# Patient Record
Sex: Female | Born: 1960
Health system: Southern US, Community
[De-identification: ages and names within clinical notes are randomized; demographics above are authoritative.]

## PROBLEM LIST (undated history)

## (undated) DIAGNOSIS — E785 Hyperlipidemia, unspecified: Secondary | ICD-10-CM

## (undated) DIAGNOSIS — I10 Essential (primary) hypertension: Secondary | ICD-10-CM

## (undated) DIAGNOSIS — B019 Varicella without complication: Secondary | ICD-10-CM

## (undated) DIAGNOSIS — N200 Calculus of kidney: Secondary | ICD-10-CM

## (undated) DIAGNOSIS — R319 Hematuria, unspecified: Secondary | ICD-10-CM

## (undated) DIAGNOSIS — R6889 Other general symptoms and signs: Secondary | ICD-10-CM

## (undated) DIAGNOSIS — T7840XA Allergy, unspecified, initial encounter: Secondary | ICD-10-CM

## (undated) DIAGNOSIS — K219 Gastro-esophageal reflux disease without esophagitis: Secondary | ICD-10-CM

## (undated) HISTORY — DX: Allergy, unspecified, initial encounter: T78.40XA

## (undated) HISTORY — DX: Hyperlipidemia, unspecified: E78.5

## (undated) HISTORY — DX: Calculus of kidney: N20.0

## (undated) HISTORY — DX: Hematuria, unspecified: R31.9

## (undated) HISTORY — DX: Other general symptoms and signs: R68.89

## (undated) HISTORY — PX: TUBAL LIGATION: SHX77

## (undated) HISTORY — PX: WISDOM TOOTH EXTRACTION: SHX21

## (undated) HISTORY — DX: Gastro-esophageal reflux disease without esophagitis: K21.9

## (undated) HISTORY — DX: Varicella without complication: B01.9

## (undated) HISTORY — DX: Essential (primary) hypertension: I10

---

## 1991-09-22 HISTORY — PX: OTHER SURGICAL HISTORY: SHX169

## 2000-11-18 ENCOUNTER — Other Ambulatory Visit: Admission: RE | Admit: 2000-11-18 | Discharge: 2000-11-18 | Payer: Self-pay | Admitting: Obstetrics and Gynecology

## 2009-06-18 ENCOUNTER — Ambulatory Visit (HOSPITAL_BASED_OUTPATIENT_CLINIC_OR_DEPARTMENT_OTHER): Admission: RE | Admit: 2009-06-18 | Discharge: 2009-06-18 | Payer: Self-pay | Admitting: Family Medicine

## 2009-06-18 ENCOUNTER — Ambulatory Visit: Payer: Self-pay | Admitting: Diagnostic Radiology

## 2012-06-16 ENCOUNTER — Ambulatory Visit (INDEPENDENT_AMBULATORY_CARE_PROVIDER_SITE_OTHER): Payer: BC Managed Care – PPO | Admitting: Family Medicine

## 2012-06-16 ENCOUNTER — Encounter: Payer: Self-pay | Admitting: Family Medicine

## 2012-06-16 VITALS — BP 141/85 | HR 68 | Temp 97.8°F | Ht 63.25 in | Wt 174.8 lb

## 2012-06-16 DIAGNOSIS — Z23 Encounter for immunization: Secondary | ICD-10-CM

## 2012-06-16 DIAGNOSIS — T7840XA Allergy, unspecified, initial encounter: Secondary | ICD-10-CM

## 2012-06-16 DIAGNOSIS — R319 Hematuria, unspecified: Secondary | ICD-10-CM

## 2012-06-16 DIAGNOSIS — J309 Allergic rhinitis, unspecified: Secondary | ICD-10-CM

## 2012-06-16 DIAGNOSIS — K219 Gastro-esophageal reflux disease without esophagitis: Secondary | ICD-10-CM

## 2012-06-16 DIAGNOSIS — N200 Calculus of kidney: Secondary | ICD-10-CM

## 2012-06-16 DIAGNOSIS — Z0000 Encounter for general adult medical examination without abnormal findings: Secondary | ICD-10-CM

## 2012-06-16 DIAGNOSIS — R030 Elevated blood-pressure reading, without diagnosis of hypertension: Secondary | ICD-10-CM

## 2012-06-16 DIAGNOSIS — IMO0001 Reserved for inherently not codable concepts without codable children: Secondary | ICD-10-CM

## 2012-06-16 DIAGNOSIS — E785 Hyperlipidemia, unspecified: Secondary | ICD-10-CM

## 2012-06-16 DIAGNOSIS — R6889 Other general symptoms and signs: Secondary | ICD-10-CM

## 2012-06-16 DIAGNOSIS — R350 Frequency of micturition: Secondary | ICD-10-CM

## 2012-06-16 DIAGNOSIS — R1310 Dysphagia, unspecified: Secondary | ICD-10-CM

## 2012-06-16 HISTORY — DX: Other general symptoms and signs: R68.89

## 2012-06-16 LAB — POCT URINALYSIS DIPSTICK
Bilirubin, UA: NEGATIVE
Blood, UA: NEGATIVE
Glucose, UA: NEGATIVE
Ketones, UA: NEGATIVE
Spec Grav, UA: 1.01

## 2012-06-16 MED ORDER — LORATADINE 10 MG PO TABS: 10.0000 mg | ORAL_TABLET | Freq: Every day | ORAL | Status: DC

## 2012-06-16 MED ORDER — TETANUS-DIPHTH-ACELL PERTUSSIS 5-2.5-18.5 LF-MCG/0.5 IM SUSP: 0.5000 mL | Freq: Once | INTRAMUSCULAR | Status: DC

## 2012-06-16 MED ORDER — RANITIDINE HCL 300 MG PO TABS: 300.0000 mg | ORAL_TABLET | Freq: Every day | ORAL | Status: DC

## 2012-06-16 NOTE — Assessment & Plan Note (Signed)
Start Ranitidine 300 mg po qhs for now and check an H Pylori, avoid offending foods. Referred to GI due to increasing dysphagia

## 2012-06-16 NOTE — Assessment & Plan Note (Signed)
Avoid trans fats, start MegaRed and check lipid panel

## 2012-06-16 NOTE — Assessment & Plan Note (Signed)
No recent episodes

## 2012-06-16 NOTE — Assessment & Plan Note (Signed)
Patient denies any previous history will recheck at next visit, check renal panel and tsh

## 2012-06-16 NOTE — Progress Notes (Signed)
Patient ID: Sarah Suarez, female   DOB: Apr 08, 1961, 51 y.o.   MRN: [id number].  JANET GOELZ [id number] 1961/03/31 06/16/2012      Progress Note New Patient  Subjective  Chief Complaint  Chief Complaint  Patient presents with  . Establish Care    new patient- feels like something is in her throat/neck when turning head    HPI  Patient is a 51 year old Caucasian female who is in today for new patient appointment. Her biggest concern is recent worsening of some dysphagia. She's had trouble off and on for years but for the past month it has been good up notably worse. She notes she is most troubled water then she goes down better but she'll choke easily when she swelling. She notes she snores wakes up choking and has trouble managing her own saliva. Does acknowledge a history of reflux but that has not been so bad recently since she changed her diet and is eating less offensive foods. She does also show with allergies and nasal congestion but is not taking medications for that. Has a history of kidney stones but has not had one recently. No fevers, chills, headache, chest pain, palpitations, shortness of breath, GU complaints  Past Medical History  Diagnosis Date  . Chicken pox as a child  . Kidney stone   . Hematuria   . Hyperlipidemia   . GERD (gastroesophageal reflux disease)   . Allergy   . Elevated BP 06/16/2012  . Neck problem 06/16/2012  . Preventative health care 06/16/2012    Past Surgical History  Procedure Date  . Abdominal hysterectomy 1993    partial- still has ovaries  . Cesarean section 1986 and 1987  . Wisdom tooth extraction 51 yrs old    Family History  Problem Relation Age of Onset  . Dementia Father 82  . Hyperlipidemia Father   . Hypertension Father   . Cancer Brother 50    throat- smoke- remission?  . Dementia Maternal Grandmother   . Heart attack Maternal Grandfather   . Cancer Paternal Grandfather     unsure of type  . Alcohol abuse Brother      History   Social History  . Marital Status: Married    Spouse Name: N/A    Number of Children: N/A  . Years of Education: N/A   Occupational History  . Not on file.   Social History Main Topics  . Smoking status: Former Games developer  . Smokeless tobacco: Never Used   Comment: a little in high school  . Alcohol Use: No  . Drug Use: No  . Sexually Active: Yes -- Female partner(s)   Other Topics Concern  . Not on file   Social History Narrative  . No narrative on file    Current Outpatient Prescriptions on File Prior to Visit  Medication Sig Dispense Refill  . loratadine (CLARITIN) 10 MG tablet Take 1 tablet (10 mg total) by mouth daily.  30 tablet  11  . ranitidine (ZANTAC) 300 MG tablet Take 1 tablet (300 mg total) by mouth at bedtime.  30 tablet  3   No current facility-administered medications on file prior to visit.    No Known Allergies  Review of Systems  Review of Systems  Constitutional: Negative for fever, chills and malaise/fatigue.  HENT: Positive for congestion and sore throat. Negative for hearing loss and nosebleeds.   Eyes: Negative for discharge.  Respiratory: Negative for cough, sputum production, shortness of breath and wheezing.  Cardiovascular: Negative for chest pain, palpitations and leg swelling.  Gastrointestinal: Positive for heartburn. Negative for nausea, vomiting, abdominal pain, diarrhea, constipation and blood in stool.  Genitourinary: Negative for dysuria, urgency, frequency and hematuria.  Musculoskeletal: Negative for myalgias, back pain and falls.  Skin: Negative for rash.  Neurological: Negative for dizziness, tremors, sensory change, focal weakness, loss of consciousness, weakness and headaches.  Endo/Heme/Allergies: Negative for polydipsia. Does not bruise/bleed easily.  Psychiatric/Behavioral: Negative for depression and suicidal ideas. The patient is not nervous/anxious and does not have insomnia.     Objective  BP 141/85   Pulse 68  Temp 97.8 F (36.6 C) (Temporal)  Ht 5' 3.25" (1.607 m)  Wt 174 lb 12.8 oz (79.289 kg)  BMI 30.72 kg/m2  SpO2 99%  Physical Exam  Physical Exam  Constitutional: She is oriented to person, place, and time and well-developed, well-nourished, and in no distress. No distress.  HENT:  Head: Normocephalic and atraumatic.  Right Ear: External ear normal.  Left Ear: External ear normal.  Nose: Nose normal.  Mouth/Throat: Oropharynx is clear and moist. No oropharyngeal exudate.  Eyes: Conjunctivae normal are normal. Pupils are equal, round, and reactive to light. Right eye exhibits no discharge. Left eye exhibits no discharge. No scleral icterus.  Neck: Normal range of motion. Neck supple. No thyromegaly present.  Cardiovascular: Normal rate, regular rhythm, normal heart sounds and intact distal pulses.   No murmur heard. Pulmonary/Chest: Effort normal and breath sounds normal. No respiratory distress. She has no wheezes. She has no rales.  Abdominal: Soft. Bowel sounds are normal. She exhibits no distension and no mass. There is no tenderness.  Musculoskeletal: Normal range of motion. She exhibits no edema and no tenderness.  Lymphadenopathy:    She has no cervical adenopathy.  Neurological: She is alert and oriented to person, place, and time. She has normal reflexes. No cranial nerve deficit. Coordination normal.  Skin: Skin is warm and dry. No rash noted. She is not diaphoretic.  Psychiatric: Mood, memory and affect normal.       Assessment & Plan  Allergic state Start Loratadine 10 mg daily  Elevated BP Patient denies any previous history will recheck at next visit, check renal panel and tsh  GERD (gastroesophageal reflux disease) Start Ranitidine 300 mg po qhs for now and check an H Pylori, avoid offending foods. Referred to GI due to increasing dysphagia  Hematuria Urine clear on dip today  Kidney stone No recent episodes  Hyperlipidemia Avoid trans fats,  start MegaRed and check lipid panel  Neck problem Patient c/o years of a sense of a lesion intermittently deep in her neck for years. Sometimes she can palpate it , it is on the left. Will check a TSH and thyroid ultrasound as well  Preventative health care Avoid trans fats, increase exercise, encouraged referral for screening colonoscopy and patient agrees, check fasting labs

## 2012-06-16 NOTE — Assessment & Plan Note (Addendum)
Avoid trans fats, increase exercise, encouraged referral for screening colonoscopy and patient agrees, check fasting labs

## 2012-06-16 NOTE — Patient Instructions (Addendum)
Preventive Care for Adults, Female A healthy lifestyle and preventive care can promote health and wellness. Preventive health guidelines for women include the following key practices.  A routine yearly physical is a good way to check with your caregiver about your health and preventive screening. It is a chance to share any concerns and updates on your health, and to receive a thorough exam.   Visit your dentist for a routine exam and preventive care every 6 months. Brush your teeth twice a day and floss once a day. Good oral hygiene prevents tooth decay and gum disease.   The frequency of eye exams is based on your age, health, family medical history, use of contact lenses, and other factors. Follow your caregiver's recommendations for frequency of eye exams.   Eat a healthy diet. Foods like vegetables, fruits, whole grains, low-fat dairy products, and lean protein foods contain the nutrients you need without too many calories. Decrease your intake of foods high in solid fats, added sugars, and salt. Eat the right amount of calories for you.Get information about a proper diet from your caregiver, if necessary.   Regular physical exercise is one of the most important things you can do for your health. Most adults should get at least 150 minutes of moderate-intensity exercise (any activity that increases your heart rate and causes you to sweat) each week. In addition, most adults need muscle-strengthening exercises on 2 or more days a week.   Maintain a healthy weight. The body mass index (BMI) is a screening tool to identify possible weight problems. It provides an estimate of body fat based on height and weight. Your caregiver can help determine your BMI, and can help you achieve or maintain a healthy weight.For adults 20 years and older:   A BMI below 18.5 is considered underweight.   A BMI of 18.5 to 24.9 is normal.   A BMI of 25 to 29.9 is considered overweight.   A BMI of 30 and above is  considered obese.   Maintain normal blood lipids and cholesterol levels by exercising and minimizing your intake of saturated fat. Eat a balanced diet with plenty of fruit and vegetables. Blood tests for lipids and cholesterol should begin at age 20 and be repeated every 5 years. If your lipid or cholesterol levels are high, you are over 50, or you are at high risk for heart disease, you may need your cholesterol levels checked more frequently.Ongoing high lipid and cholesterol levels should be treated with medicines if diet and exercise are not effective.   If you smoke, find out from your caregiver how to quit. If you do not use tobacco, do not start.   If you are pregnant, do not drink alcohol. If you are breastfeeding, be very cautious about drinking alcohol. If you are not pregnant and choose to drink alcohol, do not exceed 1 drink per day. One drink is considered to be 12 ounces (355 mL) of beer, 5 ounces (148 mL) of wine, or 1.5 ounces (44 mL) of liquor.   Avoid use of street drugs. Do not share needles with anyone. Ask for help if you need support or instructions about stopping the use of drugs.   High blood pressure causes heart disease and increases the risk of stroke. Your blood pressure should be checked at least every 1 to 2 years. Ongoing high blood pressure should be treated with medicines if weight loss and exercise are not effective.   If you are 55 to 51   years old, ask your caregiver if you should take aspirin to prevent strokes.   Diabetes screening involves taking a blood sample to check your fasting blood sugar level. This should be done once every 3 years, after age 45, if you are within normal weight and without risk factors for diabetes. Testing should be considered at a younger age or be carried out more frequently if you are overweight and have at least 1 risk factor for diabetes.   Breast cancer screening is essential preventive care for women. You should practice "breast  self-awareness." This means understanding the normal appearance and feel of your breasts and may include breast self-examination. Any changes detected, no matter how small, should be reported to a caregiver. Women in their 20s and 30s should have a clinical breast exam (CBE) by a caregiver as part of a regular health exam every 1 to 3 years. After age 40, women should have a CBE every year. Starting at age 40, women should consider having a mammography (breast X-ray test) every year. Women who have a family history of breast cancer should talk to their caregiver about genetic screening. Women at a high risk of breast cancer should talk to their caregivers about having magnetic resonance imaging (MRI) and a mammography every year.   The Pap test is a screening test for cervical cancer. A Pap test can show cell changes on the cervix that might become cervical cancer if left untreated. A Pap test is a procedure in which cells are obtained and examined from the lower end of the uterus (cervix).   Women should have a Pap test starting at age 21.   Between ages 21 and 29, Pap tests should be repeated every 2 years.   Beginning at age 30, you should have a Pap test every 3 years as long as the past 3 Pap tests have been normal.   Some women have medical problems that increase the chance of getting cervical cancer. Talk to your caregiver about these problems. It is especially important to talk to your caregiver if a new problem develops soon after your last Pap test. In these cases, your caregiver may recommend more frequent screening and Pap tests.   The above recommendations are the same for women who have or have not gotten the vaccine for human papillomavirus (HPV).   If you had a hysterectomy for a problem that was not cancer or a condition that could lead to cancer, then you no longer need Pap tests. Even if you no longer need a Pap test, a regular exam is a good idea to make sure no other problems are  starting.   If you are between ages 65 and 70, and you have had normal Pap tests going back 10 years, you no longer need Pap tests. Even if you no longer need a Pap test, a regular exam is a good idea to make sure no other problems are starting.   If you have had past treatment for cervical cancer or a condition that could lead to cancer, you need Pap tests and screening for cancer for at least 20 years after your treatment.   If Pap tests have been discontinued, risk factors (such as a new sexual partner) need to be reassessed to determine if screening should be resumed.   The HPV test is an additional test that may be used for cervical cancer screening. The HPV test looks for the virus that can cause the cell changes on the cervix.   The cells collected during the Pap test can be tested for HPV. The HPV test could be used to screen women aged 30 years and older, and should be used in women of any age who have unclear Pap test results. After the age of 30, women should have HPV testing at the same frequency as a Pap test.   Colorectal cancer can be detected and often prevented. Most routine colorectal cancer screening begins at the age of 50 and continues through age 75. However, your caregiver may recommend screening at an earlier age if you have risk factors for colon cancer. On a yearly basis, your caregiver may provide home test kits to check for hidden blood in the stool. Use of a small camera at the end of a tube, to directly examine the colon (sigmoidoscopy or colonoscopy), can detect the earliest forms of colorectal cancer. Talk to your caregiver about this at age 50, when routine screening begins. Direct examination of the colon should be repeated every 5 to 10 years through age 75, unless early forms of pre-cancerous polyps or small growths are found.   Hepatitis C blood testing is recommended for all people born from 1945 through 1965 and any individual with known risks for hepatitis C.    Practice safe sex. Use condoms and avoid high-risk sexual practices to reduce the spread of sexually transmitted infections (STIs). STIs include gonorrhea, chlamydia, syphilis, trichomonas, herpes, HPV, and human immunodeficiency virus (HIV). Herpes, HIV, and HPV are viral illnesses that have no cure. They can result in disability, cancer, and death. Sexually active women aged 25 and younger should be checked for chlamydia. Older women with new or multiple partners should also be tested for chlamydia. Testing for other STIs is recommended if you are sexually active and at increased risk.   Osteoporosis is a disease in which the bones lose minerals and strength with aging. This can result in serious bone fractures. The risk of osteoporosis can be identified using a bone density scan. Women ages 65 and over and women at risk for fractures or osteoporosis should discuss screening with their caregivers. Ask your caregiver whether you should take a calcium supplement or vitamin D to reduce the rate of osteoporosis.   Menopause can be associated with physical symptoms and risks. Hormone replacement therapy is available to decrease symptoms and risks. You should talk to your caregiver about whether hormone replacement therapy is right for you.   Use sunscreen with sun protection factor (SPF) of 30 or more. Apply sunscreen liberally and repeatedly throughout the day. You should seek shade when your shadow is shorter than you. Protect yourself by wearing long sleeves, pants, a wide-brimmed hat, and sunglasses year round, whenever you are outdoors.   Once a month, do a whole body skin exam, using a mirror to look at the skin on your back. Notify your caregiver of new moles, moles that have irregular borders, moles that are larger than a pencil eraser, or moles that have changed in shape or color.   Stay current with required immunizations.   Influenza. You need a dose every fall (or winter). The composition of  the flu vaccine changes each year, so being vaccinated once is not enough.   Pneumococcal polysaccharide. You need 1 to 2 doses if you smoke cigarettes or if you have certain chronic medical conditions. You need 1 dose at age 65 (or older) if you have never been vaccinated.   Tetanus, diphtheria, pertussis (Tdap, Td). Get 1 dose of   Tdap vaccine if you are younger than age 65, are over 65 and have contact with an infant, are a healthcare worker, are pregnant, or simply want to be protected from whooping cough. After that, you need a Td booster dose every 10 years. Consult your caregiver if you have not had at least 3 tetanus and diphtheria-containing shots sometime in your life or have a deep or dirty wound.   HPV. You need this vaccine if you are a woman age 26 or younger. The vaccine is given in 3 doses over 6 months.   Measles, mumps, rubella (MMR). You need at least 1 dose of MMR if you were born in 1957 or later. You may also need a second dose.   Meningococcal. If you are age 19 to 21 and a first-year college student living in a residence hall, or have one of several medical conditions, you need to get vaccinated against meningococcal disease. You may also need additional booster doses.   Zoster (shingles). If you are age 60 or older, you should get this vaccine.   Varicella (chickenpox). If you have never had chickenpox or you were vaccinated but received only 1 dose, talk to your caregiver to find out if you need this vaccine.   Hepatitis A. You need this vaccine if you have a specific risk factor for hepatitis A virus infection or you simply wish to be protected from this disease. The vaccine is usually given as 2 doses, 6 to 18 months apart.   Hepatitis B. You need this vaccine if you have a specific risk factor for hepatitis B virus infection or you simply wish to be protected from this disease. The vaccine is given in 3 doses, usually over 6 months.  Preventive Services /  Frequency Ages 19 to 39  Blood pressure check.** / Every 1 to 2 years.   Lipid and cholesterol check.** / Every 5 years beginning at age 20.   Clinical breast exam.** / Every 3 years for women in their 20s and 30s.   Pap test.** / Every 2 years from ages 21 through 29. Every 3 years starting at age 30 through age 65 or 70 with a history of 3 consecutive normal Pap tests.   HPV screening.** / Every 3 years from ages 30 through ages 65 to 70 with a history of 3 consecutive normal Pap tests.   Hepatitis C blood test.** / For any individual with known risks for hepatitis C.   Skin self-exam. / Monthly.   Influenza immunization.** / Every year.   Pneumococcal polysaccharide immunization.** / 1 to 2 doses if you smoke cigarettes or if you have certain chronic medical conditions.   Tetanus, diphtheria, pertussis (Tdap, Td) immunization. / A one-time dose of Tdap vaccine. After that, you need a Td booster dose every 10 years.   HPV immunization. / 3 doses over 6 months, if you are 26 and younger.   Measles, mumps, rubella (MMR) immunization. / You need at least 1 dose of MMR if you were born in 1957 or later. You may also need a second dose.   Meningococcal immunization. / 1 dose if you are age 19 to 21 and a first-year college student living in a residence hall, or have one of several medical conditions, you need to get vaccinated against meningococcal disease. You may also need additional booster doses.   Varicella immunization.** / Consult your caregiver.   Hepatitis A immunization.** / Consult your caregiver. 2 doses, 6 to 18 months   apart.   Hepatitis B immunization.** / Consult your caregiver. 3 doses usually over 6 months.  Ages 40 to 64  Blood pressure check.** / Every 1 to 2 years.   Lipid and cholesterol check.** / Every 5 years beginning at age 20.   Clinical breast exam.** / Every year after age 40.   Mammogram.** / Every year beginning at age 40 and continuing for as  long as you are in good health. Consult with your caregiver.   Pap test.** / Every 3 years starting at age 30 through age 65 or 70 with a history of 3 consecutive normal Pap tests.   HPV screening.** / Every 3 years from ages 30 through ages 65 to 70 with a history of 3 consecutive normal Pap tests.   Fecal occult blood test (FOBT) of stool. / Every year beginning at age 50 and continuing until age 75. You may not need to do this test if you get a colonoscopy every 10 years.   Flexible sigmoidoscopy or colonoscopy.** / Every 5 years for a flexible sigmoidoscopy or every 10 years for a colonoscopy beginning at age 50 and continuing until age 75.   Hepatitis C blood test.** / For all people born from 1945 through 1965 and any individual with known risks for hepatitis C.   Skin self-exam. / Monthly.   Influenza immunization.** / Every year.   Pneumococcal polysaccharide immunization.** / 1 to 2 doses if you smoke cigarettes or if you have certain chronic medical conditions.   Tetanus, diphtheria, pertussis (Tdap, Td) immunization.** / A one-time dose of Tdap vaccine. After that, you need a Td booster dose every 10 years.   Measles, mumps, rubella (MMR) immunization. / You need at least 1 dose of MMR if you were born in 1957 or later. You may also need a second dose.   Varicella immunization.** / Consult your caregiver.   Meningococcal immunization.** / Consult your caregiver.   Hepatitis A immunization.** / Consult your caregiver. 2 doses, 6 to 18 months apart.   Hepatitis B immunization.** / Consult your caregiver. 3 doses, usually over 6 months.  Ages 65 and over  Blood pressure check.** / Every 1 to 2 years.   Lipid and cholesterol check.** / Every 5 years beginning at age 20.   Clinical breast exam.** / Every year after age 40.   Mammogram.** / Every year beginning at age 40 and continuing for as long as you are in good health. Consult with your caregiver.   Pap test.** /  Every 3 years starting at age 30 through age 65 or 70 with a 3 consecutive normal Pap tests. Testing can be stopped between 65 and 70 with 3 consecutive normal Pap tests and no abnormal Pap or HPV tests in the past 10 years.   HPV screening.** / Every 3 years from ages 30 through ages 65 or 70 with a history of 3 consecutive normal Pap tests. Testing can be stopped between 65 and 70 with 3 consecutive normal Pap tests and no abnormal Pap or HPV tests in the past 10 years.   Fecal occult blood test (FOBT) of stool. / Every year beginning at age 50 and continuing until age 75. You may not need to do this test if you get a colonoscopy every 10 years.   Flexible sigmoidoscopy or colonoscopy.** / Every 5 years for a flexible sigmoidoscopy or every 10 years for a colonoscopy beginning at age 50 and continuing until age 75.   Hepatitis   C blood test.** / For all people born from [birth year] through 1965 and any individual with known risks for hepatitis C.   Osteoporosis screening.** / A one-time screening for women ages 37 and over and women at risk for fractures or osteoporosis.   Skin self-exam. / Monthly.   Influenza immunization.** / Every year.   Pneumococcal polysaccharide immunization.** / 1 dose at age 29 (or older) if you have never been vaccinated.   Tetanus, diphtheria, pertussis (Tdap, Td) immunization. / A one-time dose of Tdap vaccine if you are over 65 and have contact with an infant, are a Research scientist (physical sciences), or simply want to be protected from whooping cough. After that, you need a Td booster dose every 10 years.   Varicella immunization.** / Consult your caregiver.   Meningococcal immunization.** / Consult your caregiver.   Hepatitis A immunization.** / Consult your caregiver. 2 doses, 6 to 18 months apart.   Hepatitis B immunization.** / Check with your caregiver. 3 doses, usually over 6 months.  ** Family history and personal history of risk and conditions may change your caregiver's  recommendations. Document Released: 11/03/2001 Document Revised: 08/27/2011 Document Reviewed: 02/02/2011 [facility] Patient Information 2012 [place], [place].   MegaRed krill oil cap daily, by schiff, generic is fine

## 2012-06-16 NOTE — Assessment & Plan Note (Signed)
Urine clear on dip today

## 2012-06-16 NOTE — Assessment & Plan Note (Signed)
-   Start Loratadine 10 mg daily.

## 2012-06-16 NOTE — Assessment & Plan Note (Signed)
Patient c/o years of a sense of a lesion intermittently deep in her neck for years. Sometimes she can palpate it , it is on the left. Will check a TSH and thyroid ultrasound as well

## 2012-06-20 ENCOUNTER — Other Ambulatory Visit (INDEPENDENT_AMBULATORY_CARE_PROVIDER_SITE_OTHER): Payer: BC Managed Care – PPO

## 2012-06-20 DIAGNOSIS — Z0000 Encounter for general adult medical examination without abnormal findings: Secondary | ICD-10-CM

## 2012-06-20 LAB — RENAL FUNCTION PANEL
CO2: 25 mEq/L (ref 19–32)
Calcium: 9 mg/dL (ref 8.4–10.5)
Creatinine, Ser: 0.6 mg/dL (ref 0.4–1.2)
GFR: 104.02 mL/min (ref >60.00)
Glucose, Bld: 85 mg/dL (ref 70–99)
Potassium: 4.5 mEq/L (ref 3.5–5.1)
Sodium: 138 mEq/L (ref 135–145)

## 2012-06-20 LAB — LIPID PANEL
Cholesterol: 205 mg/dL — ABNORMAL HIGH (ref 0–200)
HDL: 52.7 mg/dL (ref >39.00)
Total CHOL/HDL Ratio: 4
Triglycerides: 65 mg/dL (ref 0.0–149.0)

## 2012-06-20 LAB — CBC
Hemoglobin: 13 g/dL (ref 12.0–15.0)
MCHC: 33.1 g/dL (ref 30.0–36.0)
Platelets: 204 10*3/uL (ref 150.0–400.0)
RDW: 12.3 % (ref 11.5–14.6)
WBC: 5.6 10*3/uL (ref 4.5–10.5)

## 2012-06-20 LAB — HEPATIC FUNCTION PANEL
Albumin: 3.9 g/dL (ref 3.5–5.2)
Total Bilirubin: 0.7 mg/dL (ref 0.3–1.2)

## 2012-06-20 LAB — TSH: TSH: 1.79 u[IU]/mL (ref 0.35–5.50)

## 2012-06-22 ENCOUNTER — Ambulatory Visit (HOSPITAL_BASED_OUTPATIENT_CLINIC_OR_DEPARTMENT_OTHER)
Admission: RE | Admit: 2012-06-22 | Discharge: 2012-06-22 | Disposition: A | Discharge status: Home / Self Care | Payer: BC Managed Care – PPO | Source: Ambulatory Visit | Attending: Family Medicine | Admitting: Family Medicine

## 2012-06-22 ENCOUNTER — Telehealth: Payer: Self-pay | Admitting: Family Medicine

## 2012-06-22 DIAGNOSIS — R1310 Dysphagia, unspecified: Secondary | ICD-10-CM

## 2012-06-22 DIAGNOSIS — IMO0001 Reserved for inherently not codable concepts without codable children: Secondary | ICD-10-CM

## 2012-06-22 NOTE — Telephone Encounter (Signed)
So have her gently massage it with Aspercreme for about the the next 2 weeks, twice a day, if no improvement, have her come in for an exam so we can decide which imaging test would be best to evaluate it

## 2012-06-22 NOTE — Telephone Encounter (Signed)
Caller: Simya/Patient; Patient Name: Sarah Suarez; PCP: Dan[facility]); Best Callback Phone Number: [phone]; Pt calling today 06/22/12 regarding she is having ultra sound done on her neck today at 2:30 PM.  She found a lump today below left shoulder on the front.  Said lump is not in breast tissue.  Wants to see if MD will include the ultra sound of this lump on the order with the neck ultra sound.  PLEASE CALL PATIENT BACK AT [phone] TO LET PT KNOW IF MD WILL INCLUDE THIS ON ULTRA SOUND ORDER TODAY.

## 2012-06-22 NOTE — Telephone Encounter (Signed)
Pt informed and voiced understanding

## 2012-06-22 NOTE — Telephone Encounter (Signed)
Would need more information, is it tender, how big, is it anterior or posterior to shoulder or in the Axillae? If she gets there and the Ultrasound tech feels it is amenable to Ultrasound I am willing to put in an order if they tell us what area we need to look at

## 2012-06-22 NOTE — Telephone Encounter (Signed)
Please advise 

## 2012-06-22 NOTE — Telephone Encounter (Signed)
Pt states its sore but has been touching it since she found it yesterday (was not sore when she first found it). Pt states it is about the size of a small bean. Its on the left side under the clavicle. Pt is already done with the ultrasound. Please advise?

## 2012-06-24 NOTE — Progress Notes (Signed)
Left detailed message on patient's home voicemail with ultrasound results.

## 2012-07-07 ENCOUNTER — Encounter: Payer: Self-pay | Admitting: Gastroenterology

## 2012-08-08 ENCOUNTER — Encounter: Payer: Self-pay | Admitting: Gastroenterology

## 2012-08-08 ENCOUNTER — Ambulatory Visit (INDEPENDENT_AMBULATORY_CARE_PROVIDER_SITE_OTHER): Payer: BC Managed Care – PPO | Admitting: Gastroenterology

## 2012-08-08 VITALS — BP 112/80 | HR 80 | Ht 63.0 in | Wt 182.4 lb

## 2012-08-08 DIAGNOSIS — K219 Gastro-esophageal reflux disease without esophagitis: Secondary | ICD-10-CM

## 2012-08-08 DIAGNOSIS — Z1211 Encounter for screening for malignant neoplasm of colon: Secondary | ICD-10-CM

## 2012-08-08 DIAGNOSIS — R1319 Other dysphagia: Secondary | ICD-10-CM

## 2012-08-08 MED ORDER — PEG-KCL-NACL-NASULF-NA ASC-C 100 G PO SOLR: 1.0000 | Freq: Once | ORAL | Status: DC

## 2012-08-08 NOTE — Progress Notes (Signed)
History of Present Illness: This is a 51 year old female who relates a several year history of a "gooey" and sticky sensation in her throat. She relates that the "gooey" and sticky sensation in her throat resolves with drinking water. She occasionally notes the symptoms at night with occasional coughing. She has no difficulty swallowing any solid or liquid food. She has rare episodes of substernal burning. She was evaluated by Dr. Cloria Spring several years ago and she report the evaluation was unremarkable and he recommended omeprazole for possible GERD. She states she took it for 2 or 3 weeks and did not note any symptom improvement and so she discontinued it. She was recently evaluated by Dr. Abner Greenspan and prescribed ranitidine 300 mg daily for possible GERD but has not taken it. Denies weight loss, abdominal pain, constipation, diarrhea, change in stool caliber, melena, hematochezia, nausea, vomiting, dysphagia, reflux symptoms, chest pain.  Review of Systems: Pertinent positive and negative review of systems were noted in the above HPI section. All other review of systems were otherwise negative.  Current Medications, Allergies, Past Medical History, Past Surgical History, Family History and Social History were reviewed in [employer] record.  Physical Exam: General: Well developed , well nourished, no acute distress Head: Normocephalic and atraumatic Eyes:  sclerae anicteric, EOMI Ears: Normal auditory acuity Mouth: No deformity or lesions Neck: Supple, no masses or thyromegaly Lungs: Clear throughout to auscultation Heart: Regular rate and rhythm; no murmurs, rubs or bruits Abdomen: Soft, non tender and non distended. No masses, hepatosplenomegaly or hernias noted. Normal Bowel sounds Rectal: Deferred to colonoscopy Musculoskeletal: Symmetrical with no gross deformities  Skin: No lesions on visible extremities Pulses:  Normal pulses noted Extremities: No clubbing, cyanosis,  edema or deformities noted Neurological: Alert oriented x 4, grossly nonfocal Cervical Nodes:  No significant cervical adenopathy Inguinal Nodes: No significant inguinal adenopathy Psychological:  Alert and cooperative. Normal mood and affect  Assessment and Recommendations:  1. Abnormal sensation in her throat and occasional nocturnal choking. Her symptoms are atypical for GERD with LPR however this is a possible diagnosis. Further evaluation with upper endoscopy and if unremarkable consider pH monitoring. The risks, benefits, and alternatives to endoscopy with possible biopsy and possible dilation were discussed with the patient and they consent to proceed.  She may need repeat ENT evaluation.   2. Colorectal cancer screening, routine risk. Schedule colonoscopy. The risks, benefits, and alternatives to colonoscopy with possible biopsy and possible polypectomy were discussed with the patient and they consent to proceed.

## 2012-08-08 NOTE — Patient Instructions (Addendum)
You have been scheduled for an endoscopy and colonoscopy with propofol. Please follow the written instructions given to you at your visit today. Please pick up your prep at the pharmacy within the next 1-3 days. If you use inhalers (even only as needed) or a CPAP machine, please bring them with you on the day of your procedure. 

## 2012-08-09 ENCOUNTER — Ambulatory Visit (AMBULATORY_SURGERY_CENTER): Payer: BC Managed Care – PPO | Admitting: Gastroenterology

## 2012-08-09 ENCOUNTER — Other Ambulatory Visit: Payer: Self-pay | Admitting: *Deleted

## 2012-08-09 ENCOUNTER — Encounter: Payer: Self-pay | Admitting: Gastroenterology

## 2012-08-09 VITALS — BP 117/72 | HR 54 | Temp 96.6°F | Resp 20 | Ht 63.0 in | Wt 182.0 lb

## 2012-08-09 DIAGNOSIS — K219 Gastro-esophageal reflux disease without esophagitis: Secondary | ICD-10-CM

## 2012-08-09 DIAGNOSIS — R1319 Other dysphagia: Secondary | ICD-10-CM

## 2012-08-09 DIAGNOSIS — D130 Benign neoplasm of esophagus: Secondary | ICD-10-CM

## 2012-08-09 MED ORDER — OMEPRAZOLE 40 MG PO CPDR: DELAYED_RELEASE_CAPSULE | ORAL | Status: DC

## 2012-08-09 MED ORDER — SODIUM CHLORIDE 0.9 % IV SOLN: 500.0000 mL | INTRAVENOUS | Status: DC

## 2012-08-09 NOTE — Progress Notes (Signed)
NO EGG OR SOY ALLERGY PER PT. EWM 

## 2012-08-09 NOTE — Progress Notes (Signed)
[id number] a/ox3 pleased with MAC report to [employer]

## 2012-08-09 NOTE — Progress Notes (Signed)
No complaints noted in the recovery room. Maw  Patient did not experience any of the following events: a burn prior to discharge; a fall within the facility; wrong site/side/patient/procedure/implant event; or a hospital transfer or hospital admission upon discharge from the facility. (G8907) Patient did not have preoperative order for IV antibiotic SSI prophylaxis. ([id number])  

## 2012-08-09 NOTE — Op Note (Signed)
[facility] [address] [place], [address]   ENDOSCOPY PROCEDURE REPORT  PATIENT: Sarah Suarez, Sarah Suarez  MR#: [id number] BIRTHDATE: Oct 21, 1960 , 51  yrs. old GENDER: Female ENDOSCOPIST: Meryl Dare, MD, Clementeen Graham REFERRED BY:  Reuel Derby, M.D. PROCEDURE DATE:  08/09/2012 PROCEDURE:  EGD w/ biopsy ASA CLASS:     Class I INDICATIONS:  History of esophageal reflux. MEDICATIONS: MAC sedation, administered by CRNA and propofol (Diprivan) 120mg  IV TOPICAL ANESTHETIC: Cetacaine Spray DESCRIPTION OF PROCEDURE: After the risks benefits and alternatives of the procedure were thoroughly explained, informed consent was obtained.  The LB GIF-H180 [id number] endoscope was introduced through the mouth and advanced to the second portion of the duodenum. Without limitations.  The instrument was slowly withdrawn as the mucosa was fully examined.   ESOPHAGUS: A small erosion at the EGJ was noted. The mucosa of the esophagus otherwise appeared normal.  Multiple random biopsies were performed in the distal esophagus. STOMACH: The mucosa and folds of the stomach appeared normal. DUODENUM: The duodenal mucosa showed no abnormalities in the bulb and second portion of the duodenum.  Retroflexed views revealed a small hiatal hernia.  The scope was then withdrawn from the patient and the procedure completed.  COMPLICATIONS: There were no complications.  ENDOSCOPIC IMPRESSION: 1.   LA Grade A esophagitis 2.   Small hiatal hernia  RECOMMENDATIONS: 1.  Await pathology results 2.  Anti-reflux regimen 3.  PPI qam: omeprazole 40 mg po qam, refills for 1 year 4.  OP follow-up in 6-8 weeks   eSigned:  Meryl Dare, MD, [facility] 08/09/2012 10:48 AM

## 2012-08-09 NOTE — Patient Instructions (Addendum)
Handout was given on GERD and hiatal hernia to your care partner.  A rx was sent to  [facility] in [place] for omeprazole 40 mg take one daily.  You may resume your current medications today as well. Please call if any questions or concerns.    YOU HAD AN ENDOSCOPIC PROCEDURE TODAY AT THE [facility]: Refer to the procedure report that was given to you for any specific questions about what was found during the examination.  If the procedure report does not answer your questions, please call your gastroenterologist to clarify.  If you requested that your care partner not be given the details of your procedure findings, then the procedure report has been included in a sealed envelope for you to review at your convenience later.  YOU SHOULD EXPECT: Some feelings of bloating in the abdomen. Passage of more gas than usual.  Walking can help get rid of the air that was put into your GI tract during the procedure and reduce the bloating. If you had a lower endoscopy (such as a colonoscopy or flexible sigmoidoscopy) you may notice spotting of blood in your stool or on the toilet paper. If you underwent a bowel prep for your procedure, then you may not have a normal bowel movement for a few days.  DIET: Your first meal following the procedure should be a light meal and then it is ok to progress to your normal diet.  A half-sandwich or bowl of soup is an example of a good first meal.  Heavy or fried foods are harder to digest and may make you feel nauseous or bloated.  Likewise meals heavy in dairy and vegetables can cause extra gas to form and this can also increase the bloating.  Drink plenty of fluids but you should avoid alcoholic beverages for 24 hours.  ACTIVITY: Your care partner should take you home directly after the procedure.  You should plan to take it easy, moving slowly for the rest of the day.  You can resume normal activity the day after the procedure however you should NOT DRIVE or use  heavy machinery for 24 hours (because of the sedation medicines used during the test).    SYMPTOMS TO REPORT IMMEDIATELY: A gastroenterologist can be reached at any hour.  During normal business hours, 8:30 AM to 5:00 PM Monday through Friday, call [phone].  After hours and on weekends, please call the GI answering service at [phone] who will take a message and have the physician on call contact you.     Following upper endoscopy (EGD)  Vomiting of blood or coffee ground material  New chest pain or pain under the shoulder blades  Painful or persistently difficult swallowing  New shortness of breath  Fever of 100F or higher  Black, tarry-looking stools  FOLLOW UP: If any biopsies were taken you will be contacted by phone or by letter within the next 1-3 weeks.  Call your gastroenterologist if you have not heard about the biopsies in 3 weeks.  Our staff will call the home number listed on your records the next business day following your procedure to check on you and address any questions or concerns that you may have at that time regarding the information given to you following your procedure. This is a courtesy call and so if there is no answer at the home number and we have not heard from you through the emergency physician on call, we will assume that you have returned to  your regular daily activities without incident.  SIGNATURES/CONFIDENTIALITY: You and/or your care partner have signed paperwork which will be entered into your electronic medical record.  These signatures attest to the fact that that the information above on your After Visit Summary has been reviewed and is understood.  Full responsibility of the confidentiality of this discharge information lies with you and/or your care-partner.

## 2012-08-10 ENCOUNTER — Telehealth: Payer: Self-pay | Admitting: *Deleted

## 2012-08-10 NOTE — Telephone Encounter (Signed)
  Follow up Call-  Call back number 08/09/2012  Post procedure Call Back phone  # [phone]  Permission to leave phone message Yes     Patient questions:  Do you have a fever, pain , or abdominal swelling? no Pain Score  0 *  Have you tolerated food without any problems? yes  Have you been able to return to your normal activities? yes  Do you have any questions about your discharge instructions: Diet   no Medications  no Follow up visit  no  Do you have questions or concerns about your Care? no  Actions: * If pain score is 4 or above: No action needed, pain <4.

## 2012-08-16 ENCOUNTER — Ambulatory Visit (AMBULATORY_SURGERY_CENTER): Payer: BC Managed Care – PPO | Admitting: Gastroenterology

## 2012-08-16 ENCOUNTER — Encounter: Payer: Self-pay | Admitting: Gastroenterology

## 2012-08-16 VITALS — BP 131/86 | HR 55 | Temp 96.9°F | Resp 21 | Ht 63.0 in | Wt 182.0 lb

## 2012-08-16 DIAGNOSIS — Z1211 Encounter for screening for malignant neoplasm of colon: Secondary | ICD-10-CM

## 2012-08-16 MED ORDER — SODIUM CHLORIDE 0.9 % IV SOLN: 500.0000 mL | INTRAVENOUS | Status: DC

## 2012-08-16 NOTE — Patient Instructions (Addendum)
Discharge instructions given with verbal understanding. Normal exam. Resume previous medications. YOU HAD AN ENDOSCOPIC PROCEDURE TODAY AT THE [facility]: Refer to the procedure report that was given to you for any specific questions about what was found during the examination.  If the procedure report does not answer your questions, please call your gastroenterologist to clarify.  If you requested that your care partner not be given the details of your procedure findings, then the procedure report has been included in a sealed envelope for you to review at your convenience later.  YOU SHOULD EXPECT: Some feelings of bloating in the abdomen. Passage of more gas than usual.  Walking can help get rid of the air that was put into your GI tract during the procedure and reduce the bloating. If you had a lower endoscopy (such as a colonoscopy or flexible sigmoidoscopy) you may notice spotting of blood in your stool or on the toilet paper. If you underwent a bowel prep for your procedure, then you may not have a normal bowel movement for a few days.  DIET: Your first meal following the procedure should be a light meal and then it is ok to progress to your normal diet.  A half-sandwich or bowl of soup is an example of a good first meal.  Heavy or fried foods are harder to digest and may make you feel nauseous or bloated.  Likewise meals heavy in dairy and vegetables can cause extra gas to form and this can also increase the bloating.  Drink plenty of fluids but you should avoid alcoholic beverages for 24 hours.  ACTIVITY: Your care partner should take you home directly after the procedure.  You should plan to take it easy, moving slowly for the rest of the day.  You can resume normal activity the day after the procedure however you should NOT DRIVE or use heavy machinery for 24 hours (because of the sedation medicines used during the test).    SYMPTOMS TO REPORT IMMEDIATELY: A gastroenterologist  can be reached at any hour.  During normal business hours, 8:30 AM to 5:00 PM Monday through Friday, call [phone].  After hours and on weekends, please call the GI answering service at [phone] who will take a message and have the physician on call contact you.   Following lower endoscopy (colonoscopy or flexible sigmoidoscopy):  Excessive amounts of blood in the stool  Significant tenderness or worsening of abdominal pains  Swelling of the abdomen that is new, acute  Fever of 100F or higher  FOLLOW UP: If any biopsies were taken you will be contacted by phone or by letter within the next 1-3 weeks.  Call your gastroenterologist if you have not heard about the biopsies in 3 weeks.  Our staff will call the home number listed on your records the next business day following your procedure to check on you and address any questions or concerns that you may have at that time regarding the information given to you following your procedure. This is a courtesy call and so if there is no answer at the home number and we have not heard from you through the emergency physician on call, we will assume that you have returned to your regular daily activities without incident.  SIGNATURES/CONFIDENTIALITY: You and/or your care partner have signed paperwork which will be entered into your electronic medical record.  These signatures attest to the fact that that the information above on your After Visit Summary has been reviewed   and is understood.  Full responsibility of the confidentiality of this discharge information lies with you and/or your care-partner. 

## 2012-08-16 NOTE — Progress Notes (Signed)
Pressure applied to the abdomen to reach the cecum 

## 2012-08-16 NOTE — Progress Notes (Signed)
Patient did not experience any of the following events: a burn prior to discharge; a fall within the facility; wrong site/side/patient/procedure/implant event; or a hospital transfer or hospital admission upon discharge from the facility. (G8907) Patient did not have preoperative order for IV antibiotic SSI prophylaxis. (G8918)  

## 2012-08-16 NOTE — Op Note (Signed)
[facility] [address] [place], [address]   COLONOSCOPY PROCEDURE REPORT  PATIENT: Sarah Suarez, Sarah Suarez  MR#: [id number] BIRTHDATE: 1961/02/03 , 51  yrs. old GENDER: Female ENDOSCOPIST: Meryl Dare, MD, [facility] REFERRED QI:ONGEX Abner Greenspan, M.D. PROCEDURE DATE:  08/16/2012 PROCEDURE:   Colonoscopy, screening ASA CLASS:   Class I INDICATIONS:average risk screening. MEDICATIONS: MAC sedation, administered by CRNA and propofol (Diprivan) 150mg  IV DESCRIPTION OF PROCEDURE:   After the risks benefits and alternatives of the procedure were thoroughly explained, informed consent was obtained.  A digital rectal exam revealed no abnormalities of the rectum.   The LB CF-H180AL [id number]  endoscope was introduced through the anus and advanced to the cecum, which was identified by both the appendix and ileocecal valve. No adverse events experienced.   The quality of the prep was good, using MoviPrep  The instrument was then slowly withdrawn as the colon was fully examined.  COLON FINDINGS: A normal appearing cecum, ileocecal valve, and appendiceal orifice were identified.  The ascending, hepatic flexure, transverse, splenic flexure, descending, sigmoid colon and rectum appeared unremarkable.  No polyps or cancers were seen. Retroflexed views revealed no abnormalities. The time to cecum=3 minutes 19 seconds.  Withdrawal time=8 minutes 45 seconds.  The scope was withdrawn and the procedure completed.  COMPLICATIONS: There were no complications.  ENDOSCOPIC IMPRESSION: 1.  Normal colon  RECOMMENDATIONS: 1.  Continue current colorectal screening recommendations for "routine risk" patients with a repeat colonoscopy in 10 years.   eSigned:  Meryl Dare, MD, [facility] 08/16/2012 8:56 AM

## 2012-08-16 NOTE — Progress Notes (Signed)
Propofol given over incremental dosages 

## 2012-08-17 ENCOUNTER — Telehealth: Payer: Self-pay | Admitting: *Deleted

## 2012-08-17 NOTE — Telephone Encounter (Signed)
  Follow up Call-  Call back number 08/16/2012 08/09/2012  Post procedure Call Back phone  # [phone] cell [phone]  Permission to leave phone message Yes Yes     Left message on answering machine to call us back if experiencing problems or has questions.

## 2012-08-19 ENCOUNTER — Encounter: Payer: Self-pay | Admitting: Gastroenterology

## 2012-08-31 ENCOUNTER — Telehealth: Payer: Self-pay

## 2012-08-31 NOTE — Telephone Encounter (Signed)
Pt left a message stating she would like her lab results from the end of Sept? I called and gave pt her lab results. Pt voiced understanding

## 2013-02-02 ENCOUNTER — Ambulatory Visit (INDEPENDENT_AMBULATORY_CARE_PROVIDER_SITE_OTHER): Payer: BC Managed Care – PPO | Admitting: Internal Medicine

## 2013-02-02 ENCOUNTER — Encounter: Payer: Self-pay | Admitting: Internal Medicine

## 2013-02-02 VITALS — BP 118/80 | HR 73 | Temp 98.1°F | Resp 12

## 2013-02-02 DIAGNOSIS — Z1329 Encounter for screening for other suspected endocrine disorder: Secondary | ICD-10-CM

## 2013-02-02 LAB — T3, FREE: T3, Free: 2.6 pg/mL (ref 2.3–4.2)

## 2013-02-02 LAB — T4, FREE: Free T4: 0.85 ng/dL (ref 0.60–1.60)

## 2013-02-02 LAB — TSH: TSH: 1.19 u[IU]/mL (ref 0.35–5.50)

## 2013-02-02 NOTE — Patient Instructions (Addendum)
Please join MyChart. I will send you the results through there. Please follow with Dr. Abner Greenspan with annual thyroid tests, but return if they become abnormal.

## 2013-02-02 NOTE — Progress Notes (Addendum)
Subjective:     Patient ID: Sarah Suarez, female   DOB: [birth year], 52 y.o.   MRN: [id number]  HPI [name] is a pleasant 52 y/o woman, self referred for possible hypothyroidism. PCP: Dr. Abner Greenspan.  Pt has had normal thyroid tests in the past, and per my review of her chart, her last TSH was 1.79 in 05/2012 at her visit with her PCP (to establish care). She also has a thyroid U/S on 06/22/2012 (as she was c/o something sticking in her throat), and this showed a normal thyroid gland, w/o nodules. She denies dysphagia, hoarseness, or neck compression spx. She has GERD, for which she saw Dr. Russella Dar in the past, an EGD last year showed mild esophagitis and a mild hiatal hernia. She was on PPIs, which solved the problem, now off and only takes them as needed.  Pt is complaining of:  - weight gain: gained 18 lbs in 10 mo. She works out 3x a week. She eats a low carb diet (no pasta, rice, bread). She eats meat + veggies.  - hot flushes, typically at night - fatigue - mostly in am, nonrestaurative sleep - pain in thigh muscles and twitching of mm - hair thinning on forehead - no dry skin - brittle, splitting nails in last 1.5 years - she had a hysterectomy in 1993, but not BSO  FH of thyroid ds in mother and aunt. No ThyCa. No autoimmune ds in family.   Review of Systems Constitutional: + weight gain, + fatigue, + hot flushes, + poor sleep Eyes: no blurry vision, no xerophthalmia ENT: no sore throat, no nodules palpated in throat, no dysphagia/odynophagia, no hoarseness Cardiovascular: no CP/SOB/palpitations/leg swelling Respiratory: no cough/SOB Gastrointestinal: no N/V/D/C Musculoskeletal: +muscle/+ joint aches Skin: no rashes, + hair loss (a little) Neurological: no tremors/numbness/tingling/dizziness Psychiatric: no depression/anxiety Low libido  Past Medical History  Diagnosis Date  . Chicken pox as a child  . Hematuria   . Hyperlipidemia   . GERD (gastroesophageal reflux disease)    . Allergy   . Elevated BP 06/16/2012  . Neck problem 06/16/2012  . Preventative health care 06/16/2012  . Kidney stone    Past Surgical History  Procedure Laterality Date  . Abdominal hysterectomy  1993    partial- still has ovaries  . Cesarean section  1986 and 1987  . Wisdom tooth extraction  52 yrs old   History   Social History  . Marital Status: Married    Spouse Name: N/A    Number of Children: 2  . Years of Education: N/A   Occupational History  . administration    Social History Main Topics  . Smoking status: Former Smoker    Types: Cigarettes    Quit date: 09/22/1979  . Smokeless tobacco: Never Used     Comment: a little in high school  . Alcohol Use: No  . Drug Use: No  . Sexually Active: Yes -- Female partner(s)   Current Outpatient Prescriptions on File Prior to Visit  Medication Sig Dispense Refill  . omeprazole (PRILOSEC) 40 MG capsule 40 mg by mouth every am  90 capsule  3   No current facility-administered medications on file prior to visit.   No Known Allergies  Family History  Problem Relation Age of Onset  . Dementia Father 53  . Hyperlipidemia Father   . Hypertension Father   . Throat cancer Brother 50     smoke- remission?  . Dementia Maternal Grandmother   . Heart attack  Maternal Grandfather   . Cancer Paternal Grandfather     unsure of type  . Alcohol abuse Brother   . Colon cancer Neg Hx   . Rectal cancer Neg Hx   . Esophageal cancer Neg Hx   . Stomach cancer Neg Hx       Objective:   Physical Exam BP 118/80  Pulse 73  Temp(Src) 98.1 F (36.7 C) (Oral)  Resp 12  SpO2 98% Wt Readings from Last 3 Encounters:  08/16/12 182 lb (82.555 kg)  08/09/12 182 lb (82.555 kg)  08/08/12 182 lb 6 oz (82.725 kg)   Constitutional: overweight, in NAD, tanned Eyes: PERRLA, EOMI, no exophthalmos ENT: moist mucous membranes, no thyromegaly, no cervical lymphadenopathy Cardiovascular: RRR, No MRG Respiratory: CTA B Gastrointestinal: abdomen  soft, NT, ND, BS+ Musculoskeletal: no deformities, strength intact in all 4 Skin: moist, warm, no rashes Neurological: no tremor with outstretched hands, DTR normal in all 4   Assessment:     1. ? hypothyroidism     Plan:     We discussed about the patient's symptoms and her previous evaluations for thyroid disorders. I explained that her TSH is completely normal, as was her thyroid ultrasound. The fact that her throat sticky sensation resolved with PPIs point towards GERD as a possible culprit. Her symptoms are very nonspecific, and, while they can be hypothyroid, they can also be associated with menopause. 52 years old is the average age for menopause, and, with normal thyroid tests, I think this is most likely the cause for her symptoms. - To be sure, I would recheck her thyroid tests, since this have been checked 6 months ago - I do not feel any thyroid nodules or thyroid enlargement on palpation - We discussed about the fact that, with her family history of thyroid disease, I would continue to check her thyroid tests on a yearly basis, which can be done during her annual physical examination with PCP. I will see the patient back if these tests become abnormal in the future. - I encouraged her to join my chart and send me any questions or concerns through there  - pt understands and agrees with the plan  Office Visit on 02/02/2013  Component Date Value Range Status  . TSH 02/02/2013 1.19  0.35 - 5.50 uIU/mL Final  . Free T4 02/02/2013 0.85  0.60 - 1.60 ng/dL Final  . T3, Free 40/98/1191 2.6  2.3 - 4.2 pg/mL Final   Letter sent.

## 2013-02-03 ENCOUNTER — Encounter: Payer: Self-pay | Admitting: Internal Medicine

## 2013-07-27 ENCOUNTER — Other Ambulatory Visit: Payer: Self-pay

## 2013-09-23 IMAGING — US US SOFT TISSUE HEAD/NECK
1 series · 14 of 25 positions shown · non-contrast
Comparison: None.

CLINICAL DATA: Dysphagia, left neck discomfort

THYROID ULTRASOUND
TECHNIQUE: Ultrasound examination of the thyroid gland and adjacent
soft tissues was performed.

[Series 1: us soft tissue head/neck · 0.08mm/px · 14 of 34 slices shown]
[im 1/34]
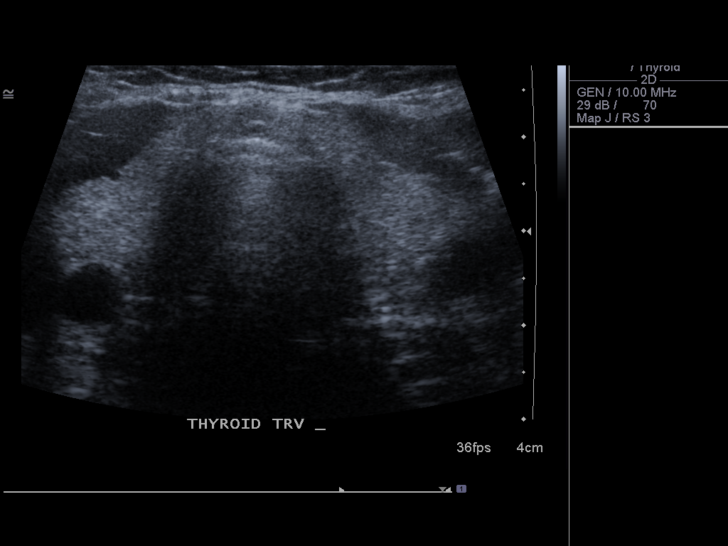
[im 3/34]
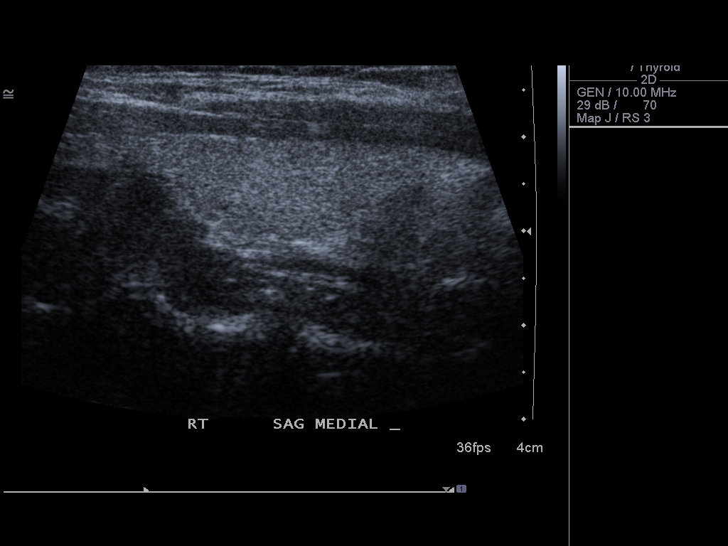
[im 6/34]
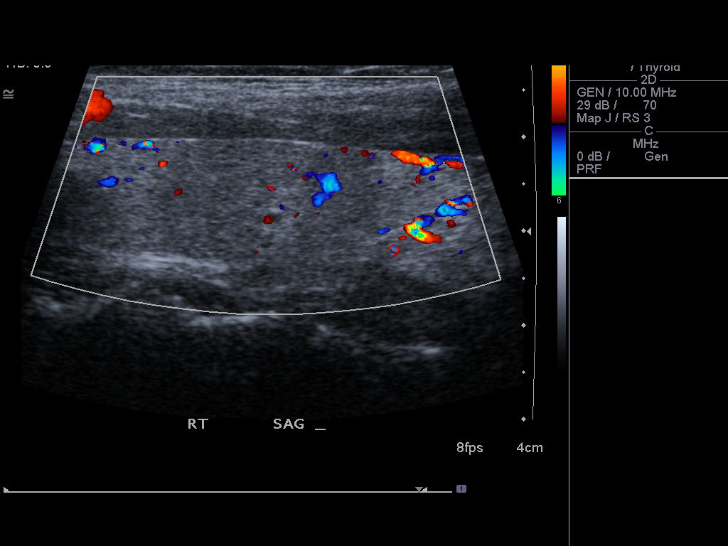
[im 9/34]
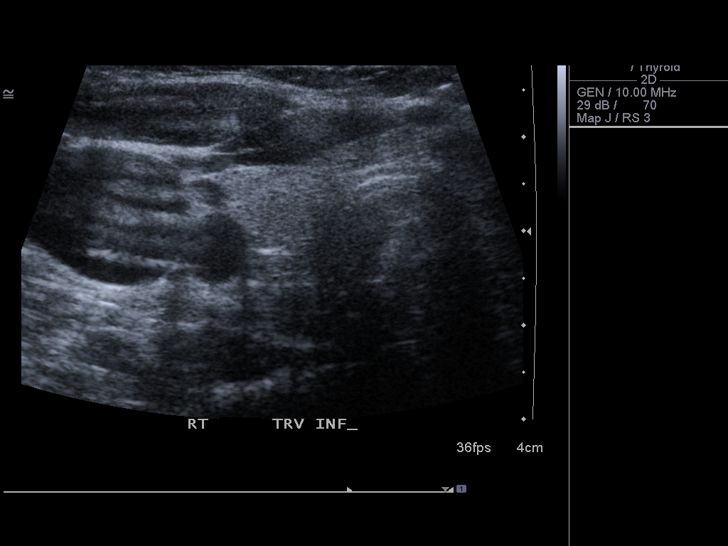
[im 12/34]
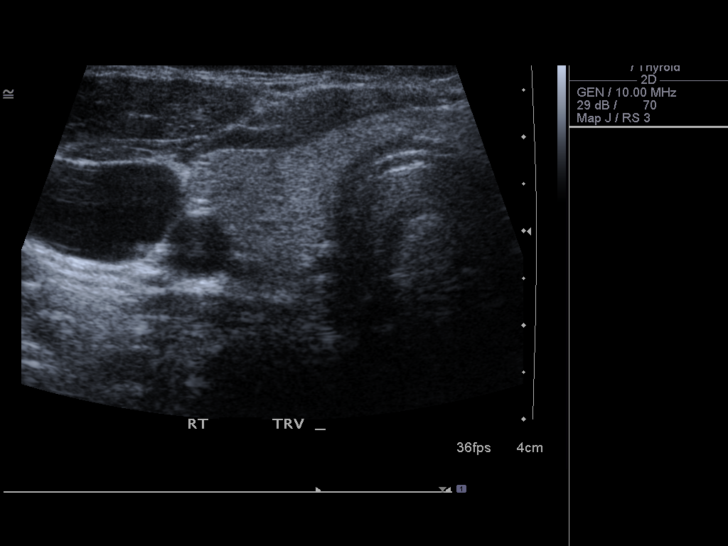
[im 13/34]
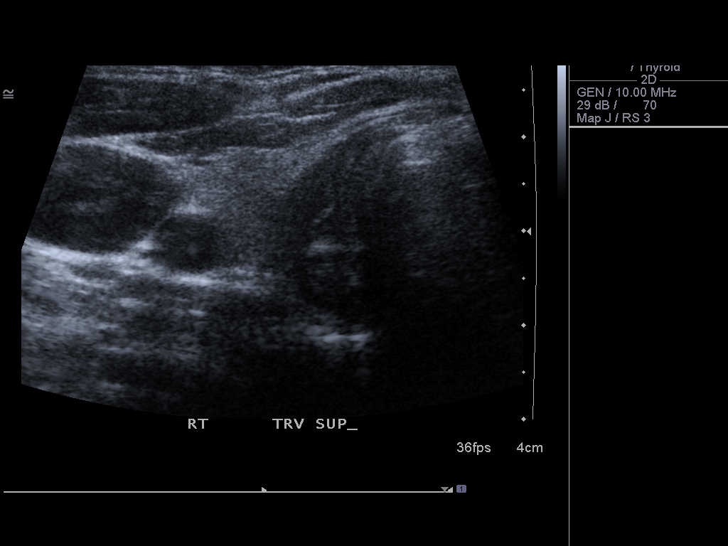
[im 16/34]
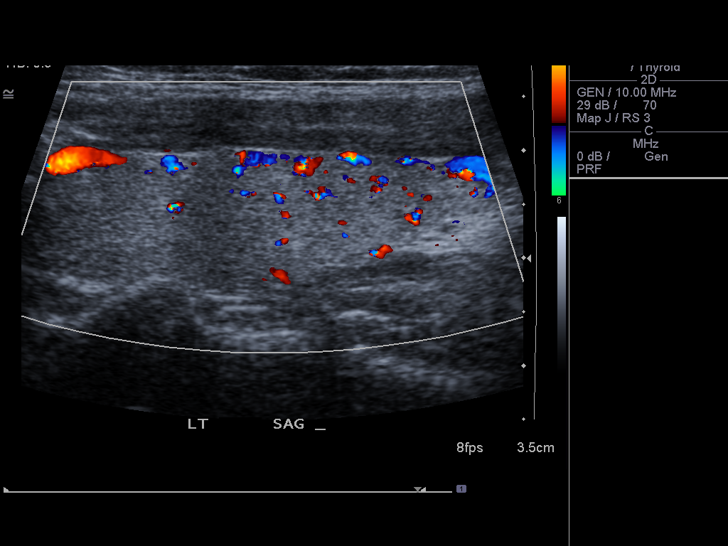
[im 18/34]
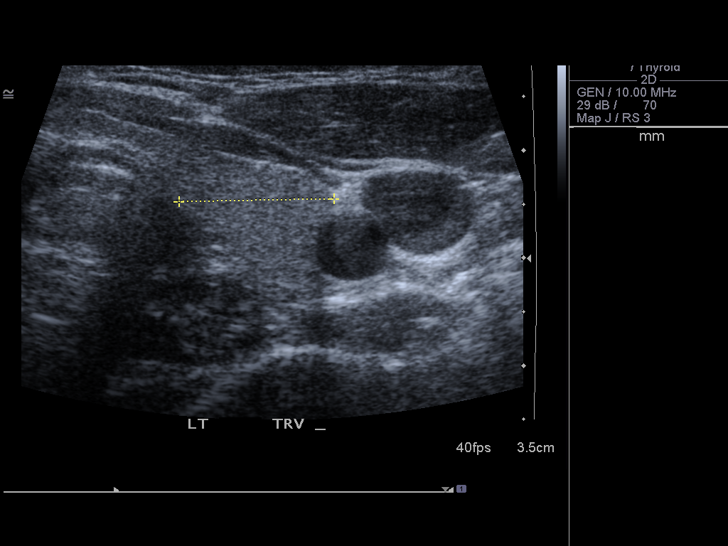
[im 21/34]
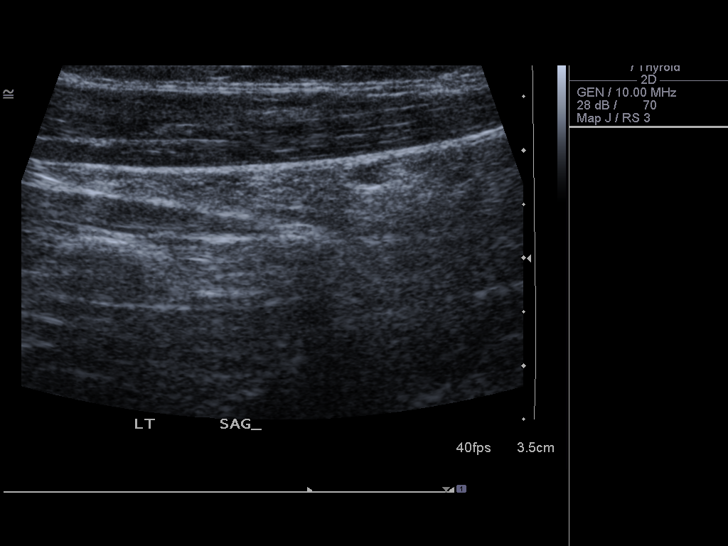
[im 23/34]
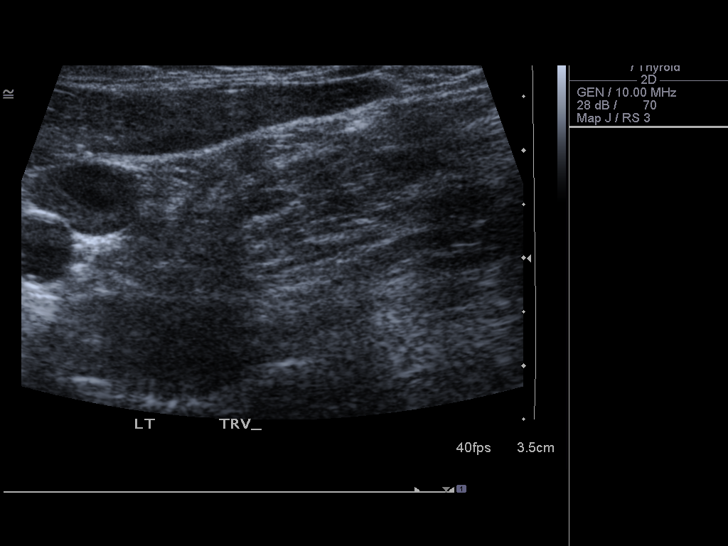
[im 25/34]
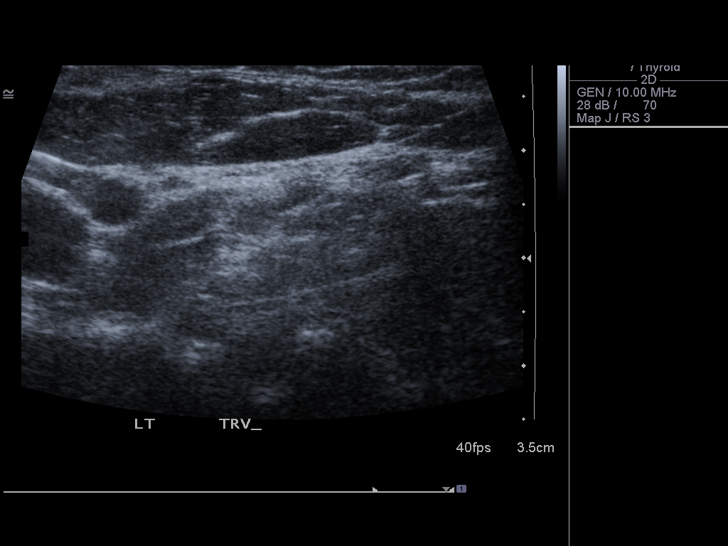
[im 28/34]
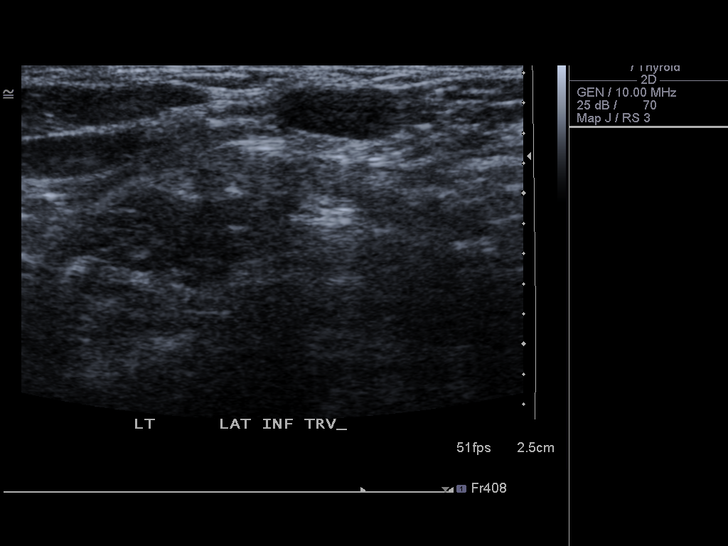
[im 31/34]
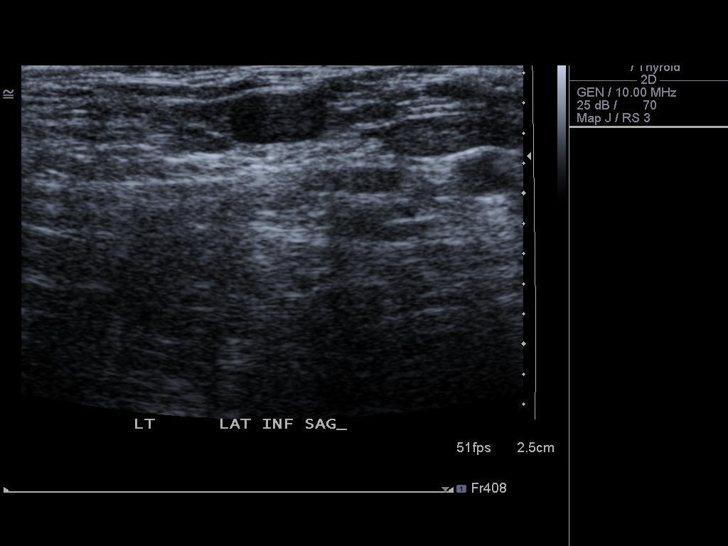
[im 34/34]
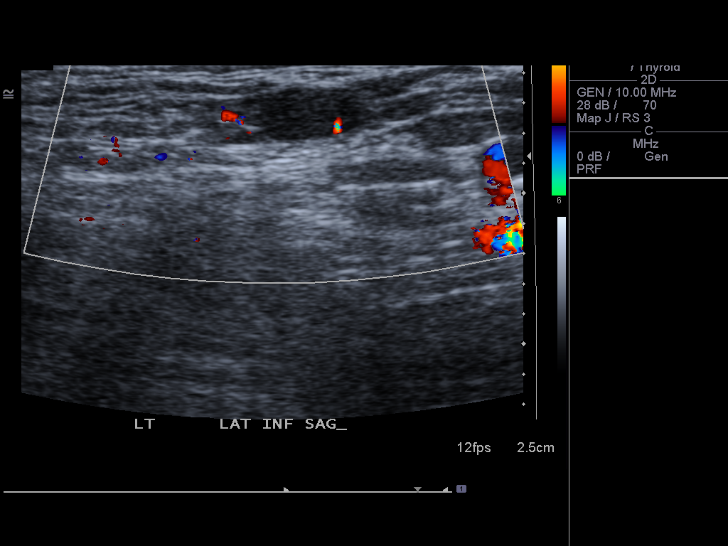

[14 of 25 positions shown; findings below may reference images not displayed]

FINDINGS: Right thyroid lobe:  14 x 16 x 45 mm, homogeneous echotexture
Left thyroid lobe:  14 x 14 x 43 mm
Isthmus:  2 mm in thickness

Focal nodules:  None

Lymphadenopathy:  No pathologically enlarged cervical lymph nodes
are noted
IMPRESSION: 1.  Negative thyroid.

## 2014-03-01 ENCOUNTER — Encounter: Payer: Self-pay | Admitting: Nurse Practitioner

## 2014-03-01 ENCOUNTER — Ambulatory Visit (INDEPENDENT_AMBULATORY_CARE_PROVIDER_SITE_OTHER): Payer: BC Managed Care – PPO | Admitting: Nurse Practitioner

## 2014-03-01 VITALS — BP 139/81 | HR 91 | Temp 98.2°F | Ht 63.5 in | Wt 209.5 lb

## 2014-03-01 DIAGNOSIS — B653 Cercarial dermatitis: Secondary | ICD-10-CM

## 2014-03-01 NOTE — Progress Notes (Signed)
   Subjective:    Patient ID: Sarah Suarez, female    DOB: 03-Jan-1961, 53 y.o.   MRN: [id number]  Rash This is a new problem. The current episode started in the past 7 days (went to beach a few days ago. Developed rash at swimsuit line in groin.). The problem is unchanged. The affected locations include the groin. The rash is characterized by itchiness and redness. Associated with: salt water. Pertinent negatives include no congestion, cough, fatigue, fever, joint pain, shortness of breath or sore throat. Treatments tried: lamisil-1 application. The treatment provided no relief.      Review of Systems  Constitutional: Negative for fever, activity change, appetite change and fatigue.  HENT: Negative for congestion and sore throat.   Respiratory: Negative for cough and shortness of breath.   Cardiovascular: Negative for chest pain.  Musculoskeletal: Negative for joint pain.  Skin: Positive for rash.       Objective:   Physical Exam  Vitals reviewed. Constitutional: She is oriented to person, place, and time. She appears well-developed and well-nourished.  HENT:  Head: Normocephalic and atraumatic.  Eyes: Conjunctivae are normal. Right eye exhibits no discharge. Left eye exhibits no discharge.  Cardiovascular: Normal rate.   Pulmonary/Chest: Effort normal. No respiratory distress.  Neurological: She is alert and oriented to person, place, and time.  Skin: Skin is warm and dry. Rash noted.     Psychiatric: She has a normal mood and affect. Her behavior is normal. Thought content normal.          Assessment & Plan:   1. Swimmer's itch See pt instructions. F/u PRN.

## 2014-03-01 NOTE — Patient Instructions (Signed)
Apply A&D ointment or vaseline daily to chaffed area. Be creative about avoiding repeated friction from elastic in underwear.  Rash should be resolved in 1 week or so. If it is itchy, you may apply benadryl cream twice daily. Cool packs will relieve itch also.  Swimmer's Itch Swimmer's itch is an itchy rash that may happen after swimming or wading. It is also called cercarial dermatitis. It happens most often in fresh water but may also occur in salt water. It happens most often during warm weather. It cannot be spread from person to person (not contagious). The rash usually lasts for about 1 week. CAUSES  The rash is due to a microscopic parasite called a schistosome. This parasite normally infects the blood of water birds (waterfowl) and mammals (such as muskrats). The eggs of the parasite get into the water from the infected animal's feces. The parasite next infects snails. After a period of time, the parasite leaves the snail in a swimming form called a larva. The larva swims around looking to burrow into the skin of a water bird or mammal. Sometimes they burrow into the skin of a human by accident. The human is not a host for the parasite so the parasite dies under the skin. Swimmer's itch is an allergic reaction to the larva under the skin.  SYMPTOMS   Tingling, burning, or itching immediately after coming out of the water.  Red bumps, most often on exposed skin that is not covered by a bathing suit. Bumps usually form on the legs and feet. The bumps can form small blisters.  Itching. Sometimes, the itching is severe. The itching and red bumps disappear after 10 to 15 hours, but later they return on the exposed skin that was in the water. Finally, the itching and rash go away after several days. DIAGNOSIS  Your caregiver can usually tell what the problem is by doing a physical exam. PREVENTION  Do not swim in areas where swimmer's itch is a known problem or where signs have been posted  warning of unsafe water.  Do not swim near or wade in [place] areas where snails are commonly found.  Towel dry or shower immediately after leaving the water. Rub your skin firmly with the towel to help remove parasites. Rubbing with alcohol may also help.  Do not feed birds near areas where people are swimming. TREATMENT  Most cases of swimmer's itch can be treated with home remedies. In severe cases, prescription strength antihistamine or steroid creams may be needed. HOME CARE INSTRUCTIONS   Apply cool compresses to the affected areas.  Bathe in Epsom salts or baking soda in water.  Soak in colloidal oatmeal baths.  Apply baking soda paste to the rash. Stir water into baking soda until it reaches a paste-like consistency.  Use an over-the-counter corticosteroid cream, antihistamine lotion, or take an antihistamine by mouth as directed by your caregiver.  Do not scratch the rash. Scratching may cause the rash to become infected. SEEK MEDICAL CARE IF:   The rash gets worse.  The rash does not improve after 3 days.  Signs of infection develop, such as redness, tenderness, or yellowish-white fluid (pus). Document Released: 11/28/2003 Document Revised: 11/30/2011 Document Reviewed: 02/04/2011 [facility] Patient Information 2014 [place], [place].

## 2014-03-01 NOTE — Progress Notes (Signed)
Pre visit review using our clinic review tool, if applicable. No additional management support is needed unless otherwise documented below in the visit note. 

## 2014-03-20 ENCOUNTER — Telehealth: Payer: Self-pay

## 2014-03-20 ENCOUNTER — Other Ambulatory Visit: Payer: Self-pay | Admitting: Nurse Practitioner

## 2014-03-20 DIAGNOSIS — L309 Dermatitis, unspecified: Secondary | ICD-10-CM

## 2014-03-20 MED ORDER — TRIAMCINOLONE ACETONIDE 0.1 % EX CREA: 1.0000 "application " | TOPICAL_CREAM | Freq: Two times a day (BID) | CUTANEOUS | Status: DC

## 2014-03-20 NOTE — Progress Notes (Unsigned)
Pt called regarding rash in creases of legs/groin. Thought to be swimmer's itch. No improvement. Will try med potency steroid. Pt to call if no improvement after 10 days or symptoms get worse.

## 2014-03-20 NOTE — Telephone Encounter (Signed)
Pt called requesting an Rx for her swimmers's Itch. She states the area is not any better. She didn't know how long she should wait for it to go away. Pt was seen on 03/01/2014. Please advise.

## 2014-03-21 NOTE — Progress Notes (Signed)
LM letting pt know Rx was sent to the pharmacy.

## 2015-04-29 ENCOUNTER — Ambulatory Visit (INDEPENDENT_AMBULATORY_CARE_PROVIDER_SITE_OTHER): Payer: BLUE CROSS/BLUE SHIELD | Admitting: Gynecology

## 2015-04-29 ENCOUNTER — Other Ambulatory Visit (HOSPITAL_COMMUNITY)
Admission: RE | Admit: 2015-04-29 | Discharge: 2015-04-29 | Disposition: A | Discharge status: Home / Self Care | Payer: BLUE CROSS/BLUE SHIELD | Source: Ambulatory Visit | Attending: Gynecology | Admitting: Gynecology

## 2015-04-29 ENCOUNTER — Encounter: Payer: Self-pay | Admitting: Gynecology

## 2015-04-29 VITALS — BP 138/86 | Ht 63.5 in | Wt 218.0 lb

## 2015-04-29 DIAGNOSIS — Z1151 Encounter for screening for human papillomavirus (HPV): Secondary | ICD-10-CM | POA: Diagnosis present

## 2015-04-29 DIAGNOSIS — Z01419 Encounter for gynecological examination (general) (routine) without abnormal findings: Secondary | ICD-10-CM | POA: Insufficient documentation

## 2015-04-29 DIAGNOSIS — Z780 Asymptomatic menopausal state: Secondary | ICD-10-CM

## 2015-04-29 NOTE — Patient Instructions (Addendum)
Bone Densitometry Bone densitometry is a special X-ray that measures your bone density and can be used to help predict your risk of bone fractures. This test is used to determine bone mineral content and density to diagnose osteoporosis. Osteoporosis is the loss of bone that may cause the bone to become weak. Osteoporosis commonly occurs in women entering menopause. However, it may be found in men and in people with other diseases. PREPARATION FOR TEST No preparation necessary. WHO SHOULD BE TESTED?  All women older than 42.  Postmenopausal women (50 to 34) with risk factors for osteoporosis.  People with a previous fracture caused by normal activities.  People with a small body frame (less than 127 poundsor a body mass index [BMI] of less than 21).  People who have a parent with a hip fracture or history of osteoporosis.  People who smoke.  People who have rheumatoid arthritis.  Anyone who engages in excessive alcohol use (more than 3 drinks most days).  Women who experience early menopause. WHEN SHOULD YOU BE RETESTED? Current guidelines suggest that you should wait at least 2 years before doing a bone density test again if your first test was normal.Recent studies indicated that women with normal bone density may be able to wait a few years before needing to repeat a bone density test. You should discuss this with your caregiver.  NORMAL FINDINGS   Normal: less than standard deviation below normal (greater than -1).  Osteopenia: 1 to 2.5 standard deviations below normal (-1 to -2.5).  Osteoporosis: greater than 2.5 standard deviations below normal (less than -2.5). Test results are reported as a "T score" and a "Z score."The T score is a number that compares your bone density with the bone density of healthy, young women.The Z score is a number that compares your bone density with the scores of women who are the same age, gender, and race.  Ranges for normal findings may vary  among different laboratories and hospitals. You should always check with your doctor after having lab work or other tests done to discuss the meaning of your test results and whether your values are considered within normal limits. MEANING OF TEST  Your caregiver will go over the test results with you and discuss the importance and meaning of your results, as well as treatment options and the need for additional tests if necessary. OBTAINING THE TEST RESULTS It is your responsibility to obtain your test results. Ask the lab or department performing the test when and how you will get your results. Document Released: 09/29/2004 Document Revised: 11/30/2011 Document Reviewed: 10/22/2010 [facility] Patient Information 2015 [place], [place]. This information is not intended to replace advice given to you by your health care provider. Make sure you discuss any questions you have with your health care provider. Menopause Menopause is the normal time of life when menstrual periods stop completely. Menopause is complete when you have missed 12 consecutive menstrual periods. It usually occurs between the ages of 26 years and 63 years. Very rarely does a woman develop menopause before the age of 13 years. At menopause, your ovaries stop producing the female hormones estrogen and progesterone. This can cause undesirable symptoms and also affect your health. Sometimes the symptoms may occur 4-5 years before the menopause begins. There is no relationship between menopause and:  Oral contraceptives.  Number of children you had.  Race.  The age your menstrual periods started (menarche). Heavy smokers and very thin women may develop menopause earlier in life.  CAUSES  The ovaries stop producing the female hormones estrogen and progesterone.  Other causes include:  Surgery to remove both ovaries.  The ovaries stop functioning for no known reason.  Tumors of the pituitary gland in the brain.  Medical disease  that affects the ovaries and hormone production.  Radiation treatment to the abdomen or pelvis.  Chemotherapy that affects the ovaries. SYMPTOMS   Hot flashes.  Night sweats.  Decrease in sex drive.  Vaginal dryness and thinning of the vagina causing painful intercourse.  Dryness of the skin and developing wrinkles.  Headaches.  Tiredness.  Irritability.  Memory problems.  Weight gain.  Bladder infections.  Hair growth of the face and chest.  Infertility. More serious symptoms include:  Loss of bone (osteoporosis) causing breaks (fractures).  Depression.  Hardening and narrowing of the arteries (atherosclerosis) causing heart attacks and strokes. DIAGNOSIS   When the menstrual periods have stopped for 12 straight months.  Physical exam.  Hormone studies of the blood. TREATMENT  There are many treatment choices and nearly as many questions about them. The decisions to treat or not to treat menopausal changes is an individual choice made with your health care provider. Your health care provider can discuss the treatments with you. Together, you can decide which treatment will work best for you. Your treatment choices may include:   Hormone therapy (estrogen and progesterone).  Non-hormonal medicines.  Treating the individual symptoms with medicine (for example antidepressants for depression).  Herbal medicines that may help specific symptoms.  Counseling by a psychiatrist or psychologist.  Group therapy.  Lifestyle changes including:  Eating healthy.  Regular exercise.  Limiting caffeine and alcohol.  Stress management and meditation.  No treatment. HOME CARE INSTRUCTIONS   Take the medicine your health care provider gives you as directed.  Get plenty of sleep and rest.  Exercise regularly.  Eat a diet that contains calcium (good for the bones) and soy products (acts like estrogen hormone).  Avoid alcoholic beverages.  Do not  smoke.  If you have hot flashes, dress in layers.  Take supplements, calcium, and vitamin D to strengthen bones.  You can use over-the-counter lubricants or moisturizers for vaginal dryness.  Group therapy is sometimes very helpful.  Acupuncture may be helpful in some cases. SEEK MEDICAL CARE IF:   You are not sure you are in menopause.  You are having menopausal symptoms and need advice and treatment.  You are still having menstrual periods after age 74 years.  You have pain with intercourse.  Menopause is complete (no menstrual period for 12 months) and you develop vaginal bleeding.  You need a referral to a specialist (gynecologist, psychiatrist, or psychologist) for treatment. SEEK IMMEDIATE MEDICAL CARE IF:   You have severe depression.  You have excessive vaginal bleeding.  You fell and think you have a broken bone.  You have pain when you urinate.  You develop leg or chest pain.  You have a fast pounding heart beat (palpitations).  You have severe headaches.  You develop vision problems.  You feel a lump in your breast.  You have abdominal pain or severe indigestion. Document Released: 11/28/2003 Document Revised: 05/10/2013 Document Reviewed: 04/06/2013 [facility] Patient Information 2015 [place], [place]. This information is not intended to replace advice given to you by your health care provider. Make sure you discuss any questions you have with your health care provider. Hormone Therapy At menopause, your body begins making less estrogen and progesterone hormones. This causes the body  to stop having menstrual periods. This is because estrogen and progesterone hormones control your periods and menstrual cycle. A lack of estrogen may cause symptoms such as:  Hot flushes (or hot flashes).  Vaginal dryness.  Dry skin.  Loss of sex drive.  Risk of bone loss (osteoporosis). When this happens, you may choose to take hormone therapy to get back the estrogen  lost during menopause. When the hormone estrogen is given alone, it is usually referred to as ET (Estrogen Therapy). When the hormone progestin is combined with estrogen, it is generally called HT (Hormone Therapy). This was formerly known as hormone replacement therapy (HRT). Your caregiver can help you make a decision on what will be best for you. The decision to use HT seems to change often as new studies are done. Many studies do not agree on the benefits of hormone replacement therapy. LIKELY BENEFITS OF HT INCLUDE PROTECTION FROM:  Hot Flushes (also called hot flashes) - A hot flush is a sudden feeling of heat that spreads over the face and body. The skin may redden like a blush. It is connected with sweats and sleep disturbance. Women going through menopause may have hot flushes a few times a month or several times per day depending on the woman.  Osteoporosis (bone loss)- Estrogen helps guard against bone loss. After menopause, a woman's bones slowly lose calcium and become weak and brittle. As a result, bones are more likely to break. The hip, wrist, and spine are affected most often. Hormone therapy can help slow bone loss after menopause. Weight bearing exercise and taking calcium with vitamin D also can help prevent bone loss. There are also medications that your caregiver can prescribe that can help prevent osteoporosis.  Vaginal Dryness - Loss of estrogen causes changes in the vagina. Its lining may become thin and dry. These changes can cause pain and bleeding during sexual intercourse. Dryness can also lead to infections. This can cause burning and itching. (Vaginal estrogen treatment can help relieve pain, itching, and dryness.)  Urinary Tract Infections are more common after menopause because of lack of estrogen. Some women also develop urinary incontinence because of low estrogen levels in the vagina and bladder.  Possible other benefits of estrogen include a positive effect on mood and  short-term memory in women. RISKS AND COMPLICATIONS  Using estrogen alone without progesterone causes the lining of the uterus to grow. This increases the risk of lining of the uterus (endometrial) cancer. Your caregiver should give another hormone called progestin if you have a uterus.  Women who take combined (estrogen and progestin) HT appear to have an increased risk of breast cancer. The risk appears to be small, but increases throughout the time that HT is taken.  Combined therapy also makes the breast tissue slightly denser which makes it harder to read mammograms (breast X-rays).  Combined, estrogen and progesterone therapy can be taken together every day, in which case there may be spotting of blood. HT therapy can be taken cyclically in which case you will have menstrual periods. Cyclically means HT is taken for a set amount of days, then not taken, then this process is repeated.  HT may increase the risk of stroke, heart attack, breast cancer and forming blood clots in your leg.  Transdermal estrogen (estrogen that is absorbed through the skin with a patch or a cream) may have more positive results with:  Cholesterol.  Blood pressure.  Blood clots. Having the following conditions may indicate you should  not have HT:  Endometrial cancer.  Liver disease.  Breast cancer.  Heart disease.  History of blood clots.  Stroke. TREATMENT   If you choose to take HT and have a uterus, usually estrogen and progestin are prescribed.  Your caregiver will help you decide the best way to take the medications.  Possible ways to take estrogen include:  Pills.  Patches.  Gels.  Sprays.  Vaginal estrogen cream, rings and tablets.  It is best to take the lowest dose possible that will help your symptoms and take them for the shortest period of time that you can.  Hormone therapy can help relieve some of the problems (symptoms) that affect women at menopause. Before making a  decision about HT, talk to your caregiver about what is best for you. Be well informed and comfortable with your decisions. HOME CARE INSTRUCTIONS   Follow your caregivers advice when taking the medications.  A Pap test is done to screen for cervical cancer.  The first Pap test should be done at age 97.  Between ages 75 and 6, Pap tests are repeated every 2 years.  Beginning at age 61, you are advised to have a Pap test every 3 years as long as your past 3 Pap tests have been normal.  Some women have medical problems that increase the chance of getting cervical cancer. Talk to your caregiver about these problems. It is especially important to talk to your caregiver if a new problem develops soon after your last Pap test. In these cases, your caregiver may recommend more frequent screening and Pap tests.  The above recommendations are the same for women who have or have not gotten the vaccine for HPV (Human Papillomavirus).  If you had a hysterectomy for a problem that was not a cancer or a condition that could lead to cancer, then you no longer need Pap tests. However, even if you no longer need a Pap test, a regular exam is a good idea to make sure no other problems are starting.   If you are between ages 33 and 31, and you have had normal Pap tests going back 10 years, you no longer need Pap tests. However, even if you no longer need a Pap test, a regular exam is a good idea to make sure no other problems are starting.   If you have had past treatment for cervical cancer or a condition that could lead to cancer, you need Pap tests and screening for cancer for at least 20 years after your treatment.  If Pap tests have been discontinued, risk factors (such as a new sexual partner) need to be re-assessed to determine if screening should be resumed.  Some women may need screenings more often if they are at high risk for cervical cancer.  Get mammograms done as per the advice of your  caregiver. SEEK IMMEDIATE MEDICAL CARE IF:  You develop abnormal vaginal bleeding.  You have pain or swelling in your legs, shortness of breath, or chest pain.  You develop dizziness or headaches.  You have lumps or changes in your breasts or armpits.  You have slurred speech.  You develop weakness or numbness of your arms or legs.  You have pain, burning, or bleeding when urinating.  You develop abdominal pain. Document Released: 06/06/2003 Document Revised: 11/30/2011 Document Reviewed: 09/24/2010 [facility] Patient Information 2015 [place], [place]. This information is not intended to replace advice given to you by your health care provider. Make sure you discuss any  questions you have with your health care provider.  

## 2015-04-29 NOTE — Progress Notes (Signed)
Sarah Suarez 30-Aug-1961 [id number]   History:    54 y.o.  for annual gyn exam is a new patient to the practice. Patient stated that her previous gynecologist was Dr. Harrington Challenger who has retired. Her last gynecological examination was in 2012. Patient states she's always had normal Pap smears. She has a past history of an abdominal hysterectomy for leiomyomatous uteri. Her ovaries are still in place. She is not had a mammogram since 2012. Her last colonoscopy was normal in 2013. She has not had a bone density study as of yet. She became menopausal at the age of 89. She reports very infrequent vasomotor symptoms. No prior history of HRT.  Past medical history,surgical history, family history and social history were all reviewed and documented in the EPIC chart.  Gynecologic History No LMP recorded. Patient has had a hysterectomy. Contraception: status post hysterectomy Last Pap: 2012. Results were: normal Last mammogram: 2012. Results were: normal  Obstetric History OB History  Gravida Para Term Preterm AB SAB TAB Ectopic Multiple Living  2 2        2     # Outcome Date GA Lbr Len/2nd Weight Sex Delivery Anes PTL Lv  2 Para     F CS-Unspec     1 Para     M CS-Unspec          ROS: A ROS was performed and pertinent positives and negatives are included in the history.  GENERAL: No fevers or chills. HEENT: No change in vision, no earache, sore throat or sinus congestion. NECK: No pain or stiffness. CARDIOVASCULAR: No chest pain or pressure. No palpitations. PULMONARY: No shortness of breath, cough or wheeze. GASTROINTESTINAL: No abdominal pain, nausea, vomiting or diarrhea, melena or bright red blood per rectum. GENITOURINARY: No urinary frequency, urgency, hesitancy or dysuria. MUSCULOSKELETAL: No joint or muscle pain, no back pain, no recent trauma. DERMATOLOGIC: No rash, no itching, no lesions. ENDOCRINE: No polyuria, polydipsia, no heat or cold intolerance. No recent change in weight.  HEMATOLOGICAL: No anemia or easy bruising or bleeding. NEUROLOGIC: No headache, seizures, numbness, tingling or weakness. PSYCHIATRIC: No depression, no loss of interest in normal activity or change in sleep pattern.     Exam: chaperone present  BP 138/86 mmHg  Ht 5' 3.5" (1.613 m)  Wt 218 lb (98.884 kg)  BMI 38.01 kg/m2  Body mass index is 38.01 kg/(m^2).  General appearance : Well developed well nourished female. No acute distress HEENT: Eyes: no retinal hemorrhage or exudates,  Neck supple, trachea midline, no carotid bruits, no thyroidmegaly Lungs: Clear to auscultation, no rhonchi or wheezes, or rib retractions  Heart: Regular rate and rhythm, no murmurs or gallops Breast:Examined in sitting and supine position were symmetrical in appearance, no palpable masses or tenderness,  no skin retraction, no nipple inversion, no nipple discharge, no skin discoloration, no axillary or supraclavicular lymphadenopathy Abdomen: no palpable masses or tenderness, no rebound or guarding Extremities: no edema or skin discoloration or tenderness  Pelvic:  Bartholin, Urethra, Skene Glands: Within normal limits             Vagina: No gross lesions or discharge  Cervix: Absent  Uterus  absent  Adnexa  Without masses or tenderness  Anus and perineum  normal   Rectovaginal  normal sphincter tone without palpated masses or tenderness             Hemoccult cards provided     Assessment/Plan:  54 y.o. female for annual exam menopausal  with minimal symptoms not requesting any HRT at this time. A requisition to schedule her mammogram and a baseline bone density study was provided. We did do a Pap smear today but we will follow with the guidelines from here on. We discussed importance of calcium and vitamin D and regular exercise for osteoporosis prevention. She was also provided with fecal Hemoccult cards and submitted to the office for testing. We also discussed importance of calcium vitamin D and regular  exercise for osteoporosis prevention. She is in the process of making an appointment with a primary care physician who will be doing her blood work then.   Uvaldo Rising H MD, 1:25 PM 04/29/2015

## 2015-05-01 LAB — CYTOLOGY - PAP

## 2015-07-11 ENCOUNTER — Other Ambulatory Visit: Payer: Self-pay

## 2015-07-11 DIAGNOSIS — Z1231 Encounter for screening mammogram for malignant neoplasm of breast: Secondary | ICD-10-CM

## 2015-08-12 ENCOUNTER — Ambulatory Visit
Admission: RE | Admit: 2015-08-12 | Discharge: 2015-08-12 | Disposition: A | Discharge status: Home / Self Care | Payer: BLUE CROSS/BLUE SHIELD | Source: Ambulatory Visit

## 2015-08-12 DIAGNOSIS — Z1231 Encounter for screening mammogram for malignant neoplasm of breast: Secondary | ICD-10-CM

## 2017-02-03 ENCOUNTER — Encounter: Payer: Self-pay | Admitting: Gynecology

## 2017-02-08 DIAGNOSIS — R5383 Other fatigue: Secondary | ICD-10-CM | POA: Diagnosis not present

## 2017-02-08 DIAGNOSIS — R7309 Other abnormal glucose: Secondary | ICD-10-CM | POA: Diagnosis not present

## 2017-02-08 DIAGNOSIS — N951 Menopausal and female climacteric states: Secondary | ICD-10-CM | POA: Diagnosis not present

## 2017-02-08 DIAGNOSIS — R109 Unspecified abdominal pain: Secondary | ICD-10-CM | POA: Diagnosis not present

## 2017-04-21 DIAGNOSIS — Z0000 Encounter for general adult medical examination without abnormal findings: Secondary | ICD-10-CM | POA: Diagnosis not present

## 2017-09-30 DIAGNOSIS — Z0000 Encounter for general adult medical examination without abnormal findings: Secondary | ICD-10-CM | POA: Diagnosis not present

## 2017-12-28 ENCOUNTER — Telehealth: Payer: Self-pay

## 2017-12-28 NOTE — Telephone Encounter (Signed)
Copied from [place] [phone]. Topic: Appointment Scheduling - Scheduling Inquiry for Clinic >> Dec 28, 2017 11:01 AM Scherrie Gerlach wrote: Reason for CRM: pt states she lives closer to [place] office, and was last seen there by [employer]. Pt would like to switch to Dr Raoul Pitch.  Pt used to see Dr Charlett Blake, but she is not readily available. Ok to switch?

## 2017-12-28 NOTE — Telephone Encounter (Signed)
[place] with me. Please call pt and set her up in a new pt slot and send her new pt packet to be completed.

## 2017-12-28 NOTE — Telephone Encounter (Signed)
Please advise 

## 2017-12-28 NOTE — Telephone Encounter (Signed)
OK with me if OK with Dr Raoul Pitch,

## 2017-12-29 ENCOUNTER — Encounter: Payer: Self-pay | Admitting: Family Medicine

## 2017-12-29 ENCOUNTER — Ambulatory Visit (INDEPENDENT_AMBULATORY_CARE_PROVIDER_SITE_OTHER): Payer: 59 | Admitting: Family Medicine

## 2017-12-29 VITALS — BP 135/82 | HR 79 | Temp 97.9°F | Ht 64.0 in | Wt 210.0 lb

## 2017-12-29 DIAGNOSIS — H6991 Unspecified Eustachian tube disorder, right ear: Secondary | ICD-10-CM | POA: Insufficient documentation

## 2017-12-29 DIAGNOSIS — H6981 Other specified disorders of Eustachian tube, right ear: Secondary | ICD-10-CM | POA: Diagnosis not present

## 2017-12-29 DIAGNOSIS — R0789 Other chest pain: Secondary | ICD-10-CM | POA: Insufficient documentation

## 2017-12-29 HISTORY — DX: Unspecified eustachian tube disorder, right ear: H69.91

## 2017-12-29 MED ORDER — FLUTICASONE PROPIONATE 50 MCG/ACT NA SUSP: 2.0000 | Freq: Every day | NASAL | 6 refills | Status: DC

## 2017-12-29 NOTE — Patient Instructions (Addendum)
Start flonase daily. Perform  Galbreath maneuver.   Eustachian Tube Dysfunction The eustachian tube connects the middle ear to the back of the nose. It regulates air pressure in the middle ear by allowing air to move between the ear and nose. It also helps to drain fluid from the middle ear space. When the eustachian tube does not function properly, air pressure, fluid, or both can build up in the middle ear. Eustachian tube dysfunction can affect one or both ears. What are the causes? This condition happens when the eustachian tube becomes blocked or cannot open normally. This may result from:  Ear infections.  Colds and other upper respiratory infections.  Allergies.  Irritation, such as from cigarette smoke or acid from the stomach coming up into the esophagus (gastroesophageal reflux).  Sudden changes in air pressure, such as from descending in an airplane.  Abnormal growths in the nose or throat, such as nasal polyps, tumors, or enlarged tissue at the back of the throat (adenoids).  What increases the risk? This condition may be more likely to develop in people who smoke and people who are overweight. Eustachian tube dysfunction may also be more likely to develop in children, especially children who have:  Certain birth defects of the mouth, such as cleft palate.  Large tonsils and adenoids.  What are the signs or symptoms? Symptoms of this condition may include:  A feeling of fullness in the ear.  Ear pain.  Clicking or popping noises in the ear.  Ringing in the ear.  Hearing loss.  Loss of balance.  Symptoms may get worse when the air pressure around you changes, such as when you travel to an area of high elevation or fly on an airplane. How is this diagnosed? This condition may be diagnosed based on:  Your symptoms.  A physical exam of your ear, nose, and throat.  Tests, such as those that measure: ? The movement of your eardrum (tympanogram). ? Your  hearing (audiometry).  How is this treated? Treatment depends on the cause and severity of your condition. If your symptoms are mild, you may be able to relieve your symptoms by moving air into ("popping") your ears. If you have symptoms of fluid in your ears, treatment may include:  Decongestants.  Antihistamines.  Nasal sprays or ear drops that contain medicines that reduce swelling (steroids).  In some cases, you may need to have a procedure to drain the fluid in your eardrum (myringotomy). In this procedure, a small tube is placed in the eardrum to:  Drain the fluid.  Restore the air in the middle ear space.  Follow these instructions at home:  Take over-the-counter and prescription medicines only as told by your health care provider.  Use techniques to help pop your ears as recommended by your health care provider. These may include: ? Chewing gum. ? Yawning. ? Frequent, forceful swallowing. ? Closing your mouth, holding your nose closed, and gently blowing as if you are trying to blow air out of your nose.  Do not do any of the following until your health care provider approves: ? Travel to high altitudes. ? Fly in airplanes. ? Work in a Pension scheme manager or room. ? Scuba dive.  Keep your ears dry. Dry your ears completely after showering or bathing.  Do not smoke.  Keep all follow-up visits as told by your health care provider. This is important. Contact a health care provider if:  Your symptoms do not go away after treatment.  Your symptoms come back after treatment.  You are unable to pop your ears.  You have: ? A fever. ? Pain in your ear. ? Pain in your head or neck. ? Fluid draining from your ear.  Your hearing suddenly changes.  You become very dizzy.  You lose your balance. This information is not intended to replace advice given to you by your health care provider. Make sure you discuss any questions you have with your health care  provider. Document Released: 10/04/2015 Document Revised: 02/13/2016 Document Reviewed: 09/26/2014 Elsevier Interactive Patient Education  2018 [place].    Hiatal Hernia A hiatal hernia occurs when part of the stomach slides above the muscle that separates the abdomen from the chest (diaphragm). A person can be born with a hiatal hernia (congenital), or it may develop over time. In almost all cases of hiatal hernia, only the top part of the stomach pushes through the diaphragm. Many people have a hiatal hernia with no symptoms. The larger the hernia, the more likely it is that you will have symptoms. In some cases, a hiatal hernia allows stomach acid to flow back into the tube that carries food from your mouth to your stomach (esophagus). This may cause heartburn symptoms. Severe heartburn symptoms may mean that you have developed a condition called gastroesophageal reflux disease (GERD). What are the causes? This condition is caused by a weakness in the opening (hiatus) where the esophagus passes through the diaphragm to attach to the upper part of the stomach. A person may be born with a weakness in the hiatus, or a weakness can develop over time. What increases the risk? This condition is more likely to develop in:  Older people. Age is a major risk factor for a hiatal hernia, especially if you are over the age of 78.  Pregnant women.  People who are overweight.  People who have frequent constipation.  What are the signs or symptoms? Symptoms of this condition usually develop in the form of GERD symptoms. Symptoms include:  Heartburn.  Belching.  Indigestion.  Trouble swallowing.  Coughing or wheezing.  Sore throat.  Hoarseness.  Chest pain.  Nausea and vomiting.  How is this diagnosed? This condition may be diagnosed during testing for GERD. Tests that may be done include:  X-rays of your stomach or chest.  An upper gastrointestinal (GI) series. This is an  X-ray exam of your GI tract that is taken after you swallow a chalky liquid that shows up clearly on the X-ray.  Endoscopy. This is a procedure to look into your stomach using a thin, flexible tube that has a tiny camera and light on the end of it.  How is this treated? This condition may be treated by:  Dietary and lifestyle changes to help reduce GERD symptoms.  Medicines. These may include: ? Over-the-counter antacids. ? Medicines that make your stomach empty more quickly. ? Medicines that block the production of stomach acid (H2 blockers). ? Stronger medicines to reduce stomach acid (proton pump inhibitors).  Surgery to repair the hernia, if other treatments are not helping.  If you have no symptoms, you may not need treatment. Follow these instructions at home: Lifestyle and activity  Do not use any products that contain nicotine or tobacco, such as cigarettes and e-cigarettes. If you need help quitting, ask your health care provider.  Try to achieve and maintain a healthy body weight.  Avoid putting pressure on your abdomen. Anything that puts pressure on your abdomen increases  the amount of acid that may be pushed up into your esophagus. ? Avoid bending over, especially after eating. ? Raise the head of your bed by putting blocks under the legs. This keeps your head and esophagus higher than your stomach. ? Do not wear tight clothing around your chest or stomach. ? Try not to strain when having a bowel movement, when urinating, or when lifting heavy objects. Eating and drinking  Avoid foods that can worsen GERD symptoms. These may include: ? Fatty foods, like fried foods. ? Citrus fruits, like oranges or lemon. ? Other foods and drinks that contain acid, like orange juice or tomatoes. ? Spicy food. ? Chocolate.  Eat frequent small meals instead of three large meals a day. This helps prevent your stomach from getting too full. ? Eat slowly. ? Do not lie down right after  eating. ? Do not eat 1-2 hours before bed.  Do not drink beverages with caffeine. These include cola, coffee, cocoa, and tea.  Do not drink alcohol. General instructions  Take over-the-counter and prescription medicines only as told by your health care provider.  Keep all follow-up visits as told by your health care provider. This is important. Contact a health care provider if:  Your symptoms are not controlled with medicines or lifestyle changes.  You are having trouble swallowing.  You have coughing or wheezing that will not go away. Get help right away if:  Your pain is getting worse.  Your pain spreads to your arms, neck, jaw, teeth, or back.  You have shortness of breath.  You sweat for no reason.  You feel sick to your stomach (nauseous) or you vomit.  You vomit blood.  You have bright red blood in your stools.  You have black, tarry stools. This information is not intended to replace advice given to you by your health care provider. Make sure you discuss any questions you have with your health care provider. Document Released: 11/28/2003 Document Revised: 08/31/2016 Document Reviewed: 08/31/2016 Elsevier Interactive Patient Education  [employer].  Please help [place] help you:  We are honored you have chosen [facility] for your [facility] home. Below you will find basic instructions that you may need to access in the future. Please help [place] help you by reading the instructions, which cover many of the frequent questions we experience.   Prescription refills and request:  -In order to allow more efficient response time, please call your pharmacy for all refills. They will forward the request electronically to [place]. This allows for the quickest possible response. Request left on a nurse line can take longer to refill, since these are checked as time allows between office patients and other phone calls.  - refill request can take up to 3-5 working days to  complete.  - If request is sent electronically and request is appropiate, it is usually completed in 1-2 business days.  - all patients will need to be seen routinely for all chronic medical conditions requiring prescription medications (see follow-up below). If you are overdue for follow up on your condition, you will be asked to make an appointment and we will call in enough medication to cover you until your appointment (up to 30 days).  - all controlled substances will require a face to face visit to request/refill.  - if you desire your prescriptions to go through a new pharmacy, and have an active script at original pharmacy, you will need to call your pharmacy and have  scripts transferred to new pharmacy. This is completed between the pharmacy locations and not by your provider.    Results: If any images or labs were ordered, it can take up to 1 week to get results depending on the test ordered and the lab/facility running and resulting the test. - Normal or stable results, which do not need further discussion, may be released to your mychart immediately with attached note to you. A call may not be generated for normal results. Please make certain to sign up for mychart. If you have questions on how to activate your mychart you can call the front office.  - If your results need further discussion, our office will attempt to contact you via phone, and if unable to reach you after 2 attempts, we will release your abnormal result to your mychart with instructions.  - All results will be automatically released in mychart after 1 week.  - Your provider will provide you with explanation and instruction on all relevant material in your results. Please keep in mind, results and labs may appear confusing or abnormal to the untrained eye, but it does not mean they are actually abnormal for you personally. If you have any questions about your results that are not covered, or you desire more detailed  explanation than what was provided, you should make an appointment with your provider to do so.   Our office handles many outgoing and incoming calls daily. If we have not contacted you within 1 week about your results, please check your mychart to see if there is a message first and if not, then contact our office.  In helping with this matter, you help decrease call volume, and therefore allow [place] to be able to respond to patients needs more efficiently.   Acute office visits (sick visit):  An acute visit is intended for a new problem and are scheduled in shorter time slots to allow schedule openings for patients with new problems. This is the appropriate visit to discuss a new problem. In order to provide you with excellent quality medical care with proper time for you to explain your problem, have an exam and receive treatment with instructions, these appointments should be limited to one new problem per visit. If you experience a new problem, in which you desire to be addressed, please make an acute office visit, we save openings on the schedule to accommodate you. Please do not save your new problem for any other type of visit, let us take care of it properly and quickly for you.   Follow up visits:  Depending on your condition(s) your provider will need to see you routinely in order to provide you with quality care and prescribe medication(s). Most chronic conditions (Example: hypertension, Diabetes, depression/anxiety... etc), require visits a couple times a year. Your provider will instruct you on proper follow up for your personal medical conditions and history. Please make certain to make follow up appointments for your condition as instructed. Failing to do so could result in lapse in your medication treatment/refills. If you request a refill, and are overdue to be seen on a condition, we will always provide you with a 30 day script (once) to allow you time to schedule.    Medicare wellness  (well visit): - we have a wonderful Nurse Maudie Mercury), that will meet with you and provide you will yearly medicare wellness visits. These visits should occur yearly (can not be scheduled less than 1 calendar year apart) and cover preventive health,  immunizations, advance directives and screenings you are entitled to yearly through your medicare benefits. Do not miss out on your entitled benefits, this is when medicare will pay for these benefits to be ordered for you.  These are strongly encouraged by your provider and is the appropriate type of visit to make certain you are up to date with all preventive health benefits. If you have not had your medicare wellness exam in the last 12 months, please make certain to schedule one by calling the office and schedule your medicare wellness with Maudie Mercury as soon as possible.   Yearly physical (well visit):  - Adults are recommended to be seen yearly for physicals. Check with your insurance and date of your last physical, most insurances require one calendar year between physicals. Physicals include all preventive health topics, screenings, medical exam and labs that are appropriate for gender/age and history. You may have fasting labs needed at this visit. This is a well visit (not a sick visit), new problems should not be covered during this visit (see acute visit).  - Pediatric patients are seen more frequently when they are younger. Your provider will advise you on well child visit timing that is appropriate for your their age. - This is not a medicare wellness visit. Medicare wellness exams do not have an exam portion to the visit. Some medicare companies allow for a physical, some do not allow a yearly physical. If your medicare allows a yearly physical you can schedule the medicare wellness with our nurse Maudie Mercury and have your physical with your provider after, on the same day. Please check with insurance for your full benefits.   Late Policy/No Shows:  - all new patients  should arrive 15-30 minutes earlier than appointment to allow [place] time  to  obtain all personal demographics,  insurance information and for you to complete office paperwork. - All established patients should arrive 10-15 minutes earlier than appointment time to update all information and be checked in .  - In our best efforts to run on time, if you are late for your appointment you will be asked to either reschedule or if able, we will work you back into the schedule. There will be a wait time to work you back in the schedule,  depending on availability.  - If you are unable to make it to your appointment as scheduled, please call 24 hours ahead of time to allow [place] to fill the time slot with someone else who needs to be seen. If you do not cancel your appointment ahead of time, you may be charged a no show fee.

## 2017-12-29 NOTE — Progress Notes (Signed)
.     Patient ID: Sarah Suarez, female  DOB: 05-19-1961, 57 y.o.   MRN: [id number] Patient Care Team    Relationship Specialty Notifications Start End  Ma Hillock, DO PCP - General Family Medicine  12/29/17   Terrance Mass, MD  Gynecology  12/29/17   Ladene Artist, MD Consulting Physician Gastroenterology  12/29/17   Kerney Elbe, MD Consulting Physician Alternative Medicine  12/29/17   Marica Otter, OD  Optometry  12/29/17     Chief Complaint  Patient presents with  . Establish Care    Tingling sensation around her ear    Subjective:  Sarah KLIEBERT is a 57 y.o.  female present for new patient establishment/transfer with 2 acute complaints. All past medical history, surgical history, allergies, family history, immunizations, medications and social history were updated in the electronic medical record today. All recent labs, ED visits and hospitalizations within the last year were reviewed.  Ear sensation: pt reports for the last few years she has had a tingling sensation, usually at night in front of her ear. She denies tinnitus, jaw popping, hearing changes, jaw pain, skin rash or discoloration or tenderness. She reports it last only for a few minutes and goes away. It typically only occurred at night, but now has progressed to every night and sometimes in the day.   Pain near abdomen: Pt points to xiphoid process and states that it is painful when she touches it and sometimes when clothing touches it. She denies abd pain, nausea, vomit, bowel changes. She reports no injury or chest pain. No radiation of pain. She denies heartburn.   Depression screen PHQ 2/9 12/29/2017  Decreased Interest 0  Down, Depressed, Hopeless 0  PHQ - 2 Score 0   No flowsheet data found.  Current Exercise Habits: Structured exercise class, Type of exercise: Other - see comments, Time (Minutes): 45(Water Aerobics), Frequency (Times/Week): 3, Weekly Exercise (Minutes/Week): 135, Intensity:  Moderate   Fall Risk  12/29/2017  Falls in the past year? No     Immunization History  Administered Date(s) Administered  . Tdap 06/16/2012    No exam data present  Past Medical History:  Diagnosis Date  . Allergy   . Chicken pox as a child  . GERD (gastroesophageal reflux disease)   . Hematuria   . Hyperlipidemia   . Kidney stone   . Neck problem 06/16/2012   No Known Allergies Past Surgical History:  Procedure Laterality Date  . ABDOMINAL HYSTERECTOMY  1993   partial- still has ovaries  . [place]  . WISDOM TOOTH EXTRACTION  57 yrs old   Family History  Problem Relation Age of Onset  . Dementia Father 49  . Hyperlipidemia Father   . Hypertension Father   . Throat cancer Brother 50        smoke- remission?  . Alcohol abuse Brother   . Dementia Maternal Grandmother   . Heart attack Maternal Grandfather   . Cancer Paternal Grandfather        unsure of type  . Alcohol abuse Brother   . Alcohol abuse Brother   . Colon cancer Neg Hx   . Rectal cancer Neg Hx   . Esophageal cancer Neg Hx   . Stomach cancer Neg Hx    Social History   Socioeconomic History  . Marital status: Married    Spouse name: Not on file  . Number of children: 2  . Years of  education: Not on file  . Highest education level: Not on file  Occupational History  . Occupation: administration    Employer: [employer]  Social Needs  . Financial resource strain: Not on file  . Food insecurity:    Worry: Not on file    Inability: Not on file  . Transportation needs:    Medical: Not on file    Non-medical: Not on file  Tobacco Use  . Smoking status: Former Smoker    Types: Cigarettes    Last attempt to quit: 09/22/1979    Years since quitting: 38.2  . Smokeless tobacco: Never Used  . Tobacco comment: a little in high school  Substance and Sexual Activity  . Alcohol use: No    Alcohol/week: 0.0 oz  . Drug use: No  . Sexual activity: Yes    Partners: Male    Lifestyle  . Physical activity:    Days per week: Not on file    Minutes per session: Not on file  . Stress: Not on file  Relationships  . Social connections:    Talks on phone: Not on file    Gets together: Not on file    Attends religious service: Not on file    Active member of club or organization: Not on file    Attends meetings of clubs or organizations: Not on file    Relationship status: Not on file  . Intimate partner violence:    Fear of current or ex partner: Not on file    Emotionally abused: Not on file    Physically abused: Not on file    Forced sexual activity: Not on file  Other Topics Concern  . Not on file  Social History Narrative   Married. 2 children.    HS graduate. Admin asst.    Exercise routinely.    Drinks caffeine, takes daily vitamin.    Feels safe in her relationships.    Allergies as of 12/29/2017   No Known Allergies     Medication List        Accurate as of 12/29/17  5:26 PM. Always use your most recent med list.          fluticasone 50 MCG/ACT nasal spray Commonly known as:  FLONASE Place 2 sprays into both nostrils daily.       All past medical history, surgical history, allergies, family history, immunizations andmedications were updated in the EMR today and reviewed under the history and medication portions of their EMR.    No results found for this or any previous visit (from the past 2160 hour(s)).  Mm Screening Breast Tomo Bilateral  Result Date: 08/12/2015 CLINICAL DATA:  Screening. EXAM: DIGITAL SCREENING BILATERAL MAMMOGRAM WITH 3D TOMO WITH CAD COMPARISON:  Previous exam(s). ACR Breast Density Category c: The breast tissue is heterogeneously dense, which may obscure small masses. FINDINGS: There are no findings suspicious for malignancy. Images were processed with CAD. IMPRESSION: No mammographic evidence of malignancy. A result letter of this screening mammogram will be mailed directly to the patient. RECOMMENDATION:  Screening mammogram in one year. (Code:SM-B-01Y) BI-RADS CATEGORY  1: Negative. Electronically Signed   By: Altamese Cabal M.D.   On: 08/13/2015 10:23    ROS: 14 pt review of systems performed and negative (unless mentioned in an HPI)  Objective: BP 135/82 (BP Location: Right Arm, Patient Position: Sitting, Cuff Size: Large)   Pulse 79   Temp 97.9 F (36.6 C) (Oral)   Ht 5\' 4"  (1.626  m)   Wt 210 lb (95.3 kg)   SpO2 98%   BMI 36.05 kg/m  Gen: Afebrile. No acute distress. Nontoxic in appearance, well-developed, well-nourished,  Very pleasant caucasian female.  HENT: AT. Halfway. Bilateral TM visualized, right TM with fullness and air bubbles, otherwise normal in appearance, normal external auditory canal. MMM. Bilateral nares within normal limits. Throat without erythema, ulcerations or exudates. no Cough on exam, no hoarseness on exam. Eyes:Pupils Equal Round Reactive to light, Extraocular movements intact,  Conjunctiva without redness, discharge or icterus. Neck/lymp/endocrine: Supple,no lymphadenopathy CV: RRR no murmur, no edema, +2/4 P posterior tibialis pulses. no carotid bruits. No JVD. Chest: CTAB, no wheeze, rhonchi or crackles. Abd: Soft. Mild TTP end of xiphoid bone. ND. BS present. Skin: No rashes, purpura or petechiae. Warm and well-perfused. Skin intact. Sensation intact.  Neuro/Msk: Normal gait. PERLA. EOMi. Alert. Oriented x3.   Psych: Normal affect, dress and demeanor. Normal speech. Normal thought content and judgment.   Assessment/plan: GRACELEE STEMMLER is a 57 y.o. female present for transfer of care within [place]- new pt to this provider.  Eustachian tube dysfunction, right Uncertain cause of her anterior ear sensation. It is not consistent with TMJ or neuralgia. No heating changes. Her exam is positive for eustachian tube dysfunction on that side compared to left side. Could be from ETD. Discussed antihistamines, flonase use daily. Galbreath maneuver instructed.  - f/u  prn  Xiphoid pain Also peculiar. TTP over xiphoid tip. No ABD pain. H/o GERD but pain is on xiphoid bone only. Discussed potential of irritation from bra. Possibly could be small hiatal hernia, but exam not that consistent.  Discusses weight loss, small meals etc.  Reassurance.  F/U PRN  No follow-ups on file. Pt encouraged to make CPE appt.   Note is dictated utilizing voice recognition software. Although note has been proof read prior to signing, occasional typographical errors still can be missed. If any questions arise, please do not hesitate to call for verification.  Electronically signed by: Howard Pouch, DO [place]

## 2017-12-29 NOTE — Telephone Encounter (Signed)
NP appt scheduled w/ Dr. Raoul Pitch today.

## 2018-02-03 ENCOUNTER — Encounter: Payer: Self-pay | Admitting: Obstetrics and Gynecology

## 2018-03-14 ENCOUNTER — Ambulatory Visit (INDEPENDENT_AMBULATORY_CARE_PROVIDER_SITE_OTHER): Payer: 59 | Admitting: Obstetrics and Gynecology

## 2018-03-14 ENCOUNTER — Encounter: Payer: Self-pay | Admitting: Obstetrics and Gynecology

## 2018-03-14 ENCOUNTER — Other Ambulatory Visit: Payer: Self-pay

## 2018-03-14 VITALS — BP 142/80 | HR 84 | Resp 16 | Ht 63.5 in | Wt 206.0 lb

## 2018-03-14 DIAGNOSIS — N951 Menopausal and female climacteric states: Secondary | ICD-10-CM

## 2018-03-14 DIAGNOSIS — Z0000 Encounter for general adult medical examination without abnormal findings: Secondary | ICD-10-CM | POA: Diagnosis not present

## 2018-03-14 DIAGNOSIS — E663 Overweight: Secondary | ICD-10-CM

## 2018-03-14 DIAGNOSIS — E039 Hypothyroidism, unspecified: Secondary | ICD-10-CM | POA: Diagnosis not present

## 2018-03-14 DIAGNOSIS — Z01419 Encounter for gynecological examination (general) (routine) without abnormal findings: Secondary | ICD-10-CM | POA: Diagnosis not present

## 2018-03-14 DIAGNOSIS — R6882 Decreased libido: Secondary | ICD-10-CM

## 2018-03-14 NOTE — Progress Notes (Signed)
57 y.o. G2P2 MarriedCaucasianF here for annual exam.  She was on bio-identical HRT with an integr[facility] in [place]. She initially was seen for weight gain, was also having hot flashes and night sweats.  She ran out about a month ago, she is having mild night sweats. Currently tolerable, but she is worried they are going to get worse.  She has not lost much weight, but hasn't gained either. H/O TVH, still has her ovaries. Sexually active, in the last few months she has had some dyspareunia on entry.     No LMP recorded. Patient has had a hysterectomy.          Sexually active: Yes.    The current method of family planning is status post hysterectomy.    Exercising: Yes.    aqua exercise  Smoker:  Former smoker  Health Maintenance: Pap:  04-29-15 WN NEG HR HPV History of abnormal Pap:  no MMG:  08-12-15 WNL Colonoscopy:  08-16-12 WNL BMD:   Never TDaP:  06-16-12 Gardasil: N/A   reports that she quit smoking about 38 years ago. Her smoking use included cigarettes. She has never used smokeless tobacco. She reports that she does not drink alcohol or use drugs. Rare ETOH. Works as an Web designer. Kids are 71 and 5, 2 grand kids. [place] kids and daughter are local.   Past Medical History:  Diagnosis Date  . Allergy   . Chicken pox as a child  . GERD (gastroesophageal reflux disease)   . Hematuria   . Hyperlipidemia   . Kidney stone   . Neck problem 06/16/2012    Past Surgical History:  Procedure Laterality Date  . [place]  . laparoscopically assisted vaginal hysterectomy  1993   partial- still has ovaries  . TUBAL LIGATION    . WISDOM TOOTH EXTRACTION  57 yrs old    Current Outpatient Medications  Medication Sig Dispense Refill  . fluticasone (FLONASE) 50 MCG/ACT nasal spray Place 2 sprays into both nostrils daily. 16 g 6  . thyroid (NATURE-THROID) 32.5 MG tablet Take 32.5 mg by mouth daily.    Marland Kitchen EC-RX TESTOSTERONE 0.4 % CREA Place onto  the skin. Apply 2 to 3 clicks once daily-rotate site Off one week a month    . NONFORMULARY OR COMPOUNDED ITEM BIEST (estriol/estradiol) 5(2.5-2.5MG ) Apply 4 clicks once daily - rotate sites  Off one week a month    . progesterone (PROMETRIUM) 200 MG capsule Take 200 mg by mouth daily. Off one week a month     No current facility-administered medications for this visit.     Family History  Problem Relation Age of Onset  . Dementia Father 53  . Hyperlipidemia Father   . Hypertension Father   . Throat cancer Brother 50        smoke- remission?  . Alcohol abuse Brother   . Dementia Maternal Grandmother   . Heart attack Maternal Grandfather   . Cancer Paternal Grandfather        unsure of type  . Alcohol abuse Brother   . Alcohol abuse Brother   . Colon cancer Neg Hx   . Rectal cancer Neg Hx   . Esophageal cancer Neg Hx   . Stomach cancer Neg Hx     Review of Systems  Constitutional: Negative.   HENT: Negative.   Eyes: Negative.   Respiratory: Negative.   Cardiovascular: Negative.   Gastrointestinal: Negative.   Endocrine: Negative.   Genitourinary: Negative.  Musculoskeletal: Negative.   Skin: Negative.   Allergic/Immunologic: Negative.   Neurological: Negative.   Psychiatric/Behavioral: Negative.     Exam:   BP (!) 142/80 (BP Location: Right Arm, Patient Position: Sitting, Cuff Size: Normal)   Pulse 84   Resp 16   Ht 5' 3.5" (1.613 m)   Wt 206 lb (93.4 kg)   BMI 35.92 kg/m   Weight change: @WEIGHTCHANGE @ Height:   Height: 5' 3.5" (161.3 cm)  Ht Readings from Last 3 Encounters:  03/14/18 5' 3.5" (1.613 m)  12/29/17 5\' 4"  (1.626 m)  04/29/15 5' 3.5" (1.613 m)    General appearance: alert, cooperative and appears stated age Head: Normocephalic, without obvious abnormality, atraumatic Neck: no adenopathy, supple, symmetrical, trachea midline and thyroid normal to inspection and palpation Lungs: clear to auscultation bilaterally Cardiovascular: regular rate  and rhythm Breasts: normal appearance, no masses or tenderness Abdomen: soft, non-tender; non distended,  no masses,  no organomegaly Extremities: extremities normal, atraumatic, no cyanosis or edema Skin: Skin color, texture, turgor normal. No rashes or lesions Lymph nodes: Cervical, supraclavicular, and axillary nodes normal. No abnormal inguinal nodes palpated Neurologic: Grossly normal   Pelvic: External genitalia:  no lesions              Urethra:  normal appearing urethra with no masses, tenderness or lesions              Bartholins and Skenes: normal                 Vagina: normal appearing vagina with normal color and discharge, no lesions              Cervix: absent               Bimanual Exam:  Uterus:  uterus absent              Adnexa: no mass, fullness, tenderness               Rectovaginal: Confirms               Anus:  normal sphincter tone, no lesions  Chaperone was present for exam.  A:  Well Woman with normal exam  Recently ran out of bio-identical HRT, so far having tolerable vasomotor symptoms  On thyroid medication from integr[facility]  Overweight   P:   No pap needed  Screening labs, testosterone level, TSH, HgbA1C  Discussed breast self exam  Mammogram overdue, # given  Colonoscopy UTD  Long discussion about HRT, discussed that bio-identical HRT is not recommended by ACOG. Discussed the reasons to go on HRT. Currently her vasomotor symptoms are tolerable. Discussed that since she doesn't have a uterus that she doesn't need progesterone  If she develops worsening vasomotor symptoms, would recommend that she try estradiol patch. She is okay with that (needs mammogram first).  Will check testosterone levels. Discussed use of testosterone, the risks and that it is not FDA approved for women. Would recommend keeping her in the normal PMP range. If she feels she needs to restart it, would need to follow her levels. She will try staying off for now  If her  thyroid testing is in the normal range, we discussed continuing her thyroid medication.

## 2018-03-14 NOTE — Patient Instructions (Signed)
EXERCISE AND DIET:  We recommended that you start or continue a regular exercise program for good health. Regular exercise means any activity that makes your heart beat faster and makes you sweat.  We recommend exercising at least 30 minutes per day at least 3 days a week, preferably 4 or 5.  We also recommend a diet low in fat and sugar.  Inactivity, poor dietary choices and obesity can cause diabetes, heart attack, stroke, and kidney damage, among others.    ALCOHOL AND SMOKING:  Women should limit their alcohol intake to no more than 7 drinks/beers/glasses of wine (combined, not each!) per week. Moderation of alcohol intake to this level decreases your risk of breast cancer and liver damage. And of course, no recreational drugs are part of a healthy lifestyle.  And absolutely no smoking or even second hand smoke. Most people know smoking can cause heart and lung diseases, but did you know it also contributes to weakening of your bones? Aging of your skin?  Yellowing of your teeth and nails?  CALCIUM AND VITAMIN D:  Adequate intake of calcium and Vitamin D are recommended.  The recommendations for exact amounts of these supplements seem to change often, but generally speaking 600 mg of calcium (either carbonate or citrate) and 800 units of Vitamin D per day seems prudent. Certain women may benefit from higher intake of Vitamin D.  If you are among these women, your doctor will have told you during your visit.    PAP SMEARS:  Pap smears, to check for cervical cancer or precancers,  have traditionally been done yearly, although recent scientific advances have shown that most women can have pap smears less often.  However, every woman still should have a physical exam from her gynecologist every year. It will include a breast check, inspection of the vulva and vagina to check for abnormal growths or skin changes, a visual exam of the cervix, and then an exam to evaluate the size and shape of the uterus and  ovaries.  And after 57 years of age, a rectal exam is indicated to check for rectal cancers. We will also provide age appropriate advice regarding health maintenance, like when you should have certain vaccines, screening for sexually transmitted diseases, bone density testing, colonoscopy, mammograms, etc.   MAMMOGRAMS:  All women over 40 years old should have a yearly mammogram. Many facilities now offer a "3D" mammogram, which may cost around $50 extra out of pocket. If possible,  we recommend you accept the option to have the 3D mammogram performed.  It both reduces the number of women who will be called back for extra views which then turn out to be normal, and it is better than the routine mammogram at detecting truly abnormal areas.    COLONOSCOPY:  Colonoscopy to screen for colon cancer is recommended for all women at age 50.  We know, you hate the idea of the prep.  We agree, BUT, having colon cancer and not knowing it is worse!!  Colon cancer so often starts as a polyp that can be seen and removed at colonscopy, which can quite literally save your life!  And if your first colonoscopy is normal and you have no family history of colon cancer, most women don't have to have it again for 10 years.  Once every ten years, you can do something that may end up saving your life, right?  We will be happy to help you get it scheduled when you are ready.    Be sure to check your insurance coverage so you understand how much it will cost.  It may be covered as a preventative service at no cost, but you should check your particular policy.      Breast Self-Awareness Breast self-awareness means being familiar with how your breasts look and feel. It involves checking your breasts regularly and reporting any changes to your health care provider. Practicing breast self-awareness is important. A change in your breasts can be a sign of a serious medical problem. Being familiar with how your breasts look and feel allows  you to find any problems early, when treatment is more likely to be successful. All women should practice breast self-awareness, including women who have had breast implants. How to do a breast self-exam One way to learn what is normal for your breasts and whether your breasts are changing is to do a breast self-exam. To do a breast self-exam: Look for Changes  1. Remove all the clothing above your waist. 2. Stand in front of a mirror in a room with good lighting. 3. Put your hands on your hips. 4. Push your hands firmly downward. 5. Compare your breasts in the mirror. Look for differences between them (asymmetry), such as: ? Differences in shape. ? Differences in size. ? Puckers, dips, and bumps in one breast and not the other. 6. Look at each breast for changes in your skin, such as: ? Redness. ? Scaly areas. 7. Look for changes in your nipples, such as: ? Discharge. ? Bleeding. ? Dimpling. ? Redness. ? A change in position. Feel for Changes  Carefully feel your breasts for lumps and changes. It is best to do this while lying on your back on the floor and again while sitting or standing in the shower or tub with soapy water on your skin. Feel each breast in the following way:  Place the arm on the side of the breast you are examining above your head.  Feel your breast with the other hand.  Start in the nipple area and make  inch (2 cm) overlapping circles to feel your breast. Use the pads of your three middle fingers to do this. Apply light pressure, then medium pressure, then firm pressure. The light pressure will allow you to feel the tissue closest to the skin. The medium pressure will allow you to feel the tissue that is a little deeper. The firm pressure will allow you to feel the tissue close to the ribs.  Continue the overlapping circles, moving downward over the breast until you feel your ribs below your breast.  Move one finger-width toward the center of the body.  Continue to use the  inch (2 cm) overlapping circles to feel your breast as you move slowly up toward your collarbone.  Continue the up and down exam using all three pressures until you reach your armpit.  Write Down What You Find  Write down what is normal for each breast and any changes that you find. Keep a written record with breast changes or normal findings for each breast. By writing this information down, you do not need to depend only on memory for size, tenderness, or location. Write down where you are in your menstrual cycle, if you are still menstruating. If you are having trouble noticing differences in your breasts, do not get discouraged. With time you will become more familiar with the variations in your breasts and more comfortable with the exam. How often should I examine my breasts? Examine   your breasts every month. If you are breastfeeding, the best time to examine your breasts is after a feeding or after using a breast pump. If you menstruate, the best time to examine your breasts is 5-7 days after your period is over. During your period, your breasts are lumpier, and it may be more difficult to notice changes. When should I see my health care provider? See your health care provider if you notice:  A change in shape or size of your breasts or nipples.  A change in the skin of your breast or nipples, such as a reddened or scaly area.  Unusual discharge from your nipples.  A lump or thick area that was not there before.  Pain in your breasts.  Anything that concerns you.  This information is not intended to replace advice given to you by your health care provider. Make sure you discuss any questions you have with your health care provider. Document Released: 09/07/2005 Document Revised: 02/13/2016 Document Reviewed: 07/28/2015 Elsevier Interactive Patient Education  2018 [employer]  

## 2018-03-16 ENCOUNTER — Other Ambulatory Visit: Payer: Self-pay | Admitting: Obstetrics and Gynecology

## 2018-03-16 DIAGNOSIS — Z1231 Encounter for screening mammogram for malignant neoplasm of breast: Secondary | ICD-10-CM

## 2018-03-16 LAB — HEMOGLOBIN A1C
ESTIMATED AVERAGE GLUCOSE: 103 mg/dL
Hgb A1c MFr Bld: 5.2 % (ref 4.8–5.6)

## 2018-03-16 LAB — THYROID PANEL WITH TSH
FREE THYROXINE INDEX: 1.4 (ref 1.2–4.9)
T3 UPTAKE RATIO: 24 % (ref 24–39)
T4 TOTAL: 5.9 ug/dL (ref 4.5–12.0)
TSH: 2.86 u[IU]/mL (ref 0.450–4.500)

## 2018-03-16 LAB — COMPREHENSIVE METABOLIC PANEL
ALT: 12 IU/L (ref 0–32)
AST: 14 IU/L (ref 0–40)
Albumin/Globulin Ratio: 2 (ref 1.2–2.2)
Albumin: 4.5 g/dL (ref 3.5–5.5)
Alkaline Phosphatase: 88 IU/L (ref 39–117)
BUN/Creatinine Ratio: 14 (ref 9–23)
BUN: 9 mg/dL (ref 6–24)
Bilirubin Total: 0.3 mg/dL (ref 0.0–1.2)
CALCIUM: 9.2 mg/dL (ref 8.7–10.2)
CO2: 24 mmol/L (ref 20–29)
CREATININE: 0.66 mg/dL (ref 0.57–1.00)
Chloride: 103 mmol/L (ref 96–106)
GFR calc Af Amer: 114 mL/min/{1.73_m2} (ref >59)
GFR, EST NON AFRICAN AMERICAN: 99 mL/min/{1.73_m2} (ref >59)
Globulin, Total: 2.2 g/dL (ref 1.5–4.5)
Glucose: 88 mg/dL (ref 65–99)
Potassium: 4 mmol/L (ref 3.5–5.2)
Sodium: 140 mmol/L (ref 134–144)
TOTAL PROTEIN: 6.7 g/dL (ref 6.0–8.5)

## 2018-03-16 LAB — TESTT+TESTF+SHBG
SEX HORMONE BINDING: 84.8 nmol/L (ref 17.3–125.0)
TESTOSTERONE FREE: 2.5 pg/mL (ref 0.0–4.2)
TESTOSTERONE, TOTAL: 28.8 ng/dL

## 2018-03-16 LAB — LIPID PANEL
Chol/HDL Ratio: 4.6 ratio — ABNORMAL HIGH (ref 0.0–4.4)
Cholesterol, Total: 239 mg/dL — ABNORMAL HIGH (ref 100–199)
HDL: 52 mg/dL (ref >39)
LDL Calculated: 164 mg/dL — ABNORMAL HIGH (ref 0–99)
Triglycerides: 115 mg/dL (ref 0–149)
VLDL Cholesterol Cal: 23 mg/dL (ref 5–40)

## 2018-03-16 LAB — CBC
HEMATOCRIT: 39.2 % (ref 34.0–46.6)
Hemoglobin: 13 g/dL (ref 11.1–15.9)
MCH: 30.4 pg (ref 26.6–33.0)
MCHC: 33.2 g/dL (ref 31.5–35.7)
MCV: 92 fL (ref 79–97)
PLATELETS: 291 10*3/uL (ref 150–450)
RBC: 4.28 x10E6/uL (ref 3.77–5.28)
RDW: 13 % (ref 12.3–15.4)
WBC: 8.5 10*3/uL (ref 3.4–10.8)

## 2018-03-16 LAB — VITAMIN D 25 HYDROXY (VIT D DEFICIENCY, FRACTURES): VIT D 25 HYDROXY: 64.8 ng/mL (ref 30.0–100.0)

## 2018-03-18 ENCOUNTER — Telehealth: Payer: Self-pay | Admitting: *Deleted

## 2018-03-18 NOTE — Telephone Encounter (Signed)
Call to patient to review results and instructions. No answer and no voice mail.

## 2018-03-18 NOTE — Telephone Encounter (Signed)
-----   Message from Salvadore Dom, MD sent at 03/17/2018  4:45 PM EDT ----- Please let the patient know that her lipid panel is abnormal. The rest of her lab work, including her testosterone levels are normal. Her thyroid function is normal. Please call in a one year supply of her nature-thyroid 32.5 mg daily. For her lipids I would recommend a mediterranean diet, regular exercise and weight loss.  A mediterranean diet is high in fruits, vegetables, whole grains, fish, chicken, nuts, healthy fats (olive oil or canola oil). Low fat dairy. Limit butter, margarine, red meat and sweets.  She should have a fasting lipid panel in one year

## 2018-03-22 NOTE — Telephone Encounter (Signed)
Attempted to reach patient at number provided, there was no answer and no voicemail box set up.

## 2018-03-25 MED ORDER — THYROID 32.5 MG PO TABS: 32.5000 mg | ORAL_TABLET | Freq: Every day | ORAL | 11 refills | Status: DC

## 2018-03-25 NOTE — Telephone Encounter (Signed)
Spoke with patient. Results given. Patient verbalizes understanding. Rx for Petra Kuba Thyroid 32.5 mg daily #30 11RF sent to pharmacy on file. Patient verbalizes understanding. Encounter closed.

## 2018-04-11 ENCOUNTER — Ambulatory Visit
Admission: RE | Admit: 2018-04-11 | Discharge: 2018-04-11 | Disposition: A | Discharge status: Home / Self Care | Payer: 59 | Source: Ambulatory Visit | Attending: Obstetrics and Gynecology | Admitting: Obstetrics and Gynecology

## 2018-04-11 DIAGNOSIS — Z1231 Encounter for screening mammogram for malignant neoplasm of breast: Secondary | ICD-10-CM

## 2018-04-13 ENCOUNTER — Other Ambulatory Visit: Payer: Self-pay | Admitting: Obstetrics and Gynecology

## 2018-04-13 DIAGNOSIS — R928 Other abnormal and inconclusive findings on diagnostic imaging of breast: Secondary | ICD-10-CM

## 2018-04-18 ENCOUNTER — Ambulatory Visit
Admission: RE | Admit: 2018-04-18 | Discharge: 2018-04-18 | Disposition: A | Discharge status: Home / Self Care | Payer: 59 | Source: Ambulatory Visit | Attending: Obstetrics and Gynecology | Admitting: Obstetrics and Gynecology

## 2018-04-18 DIAGNOSIS — N6489 Other specified disorders of breast: Secondary | ICD-10-CM | POA: Diagnosis not present

## 2018-04-18 DIAGNOSIS — R928 Other abnormal and inconclusive findings on diagnostic imaging of breast: Secondary | ICD-10-CM

## 2018-04-18 DIAGNOSIS — R922 Inconclusive mammogram: Secondary | ICD-10-CM | POA: Diagnosis not present

## 2018-08-30 ENCOUNTER — Ambulatory Visit (INDEPENDENT_AMBULATORY_CARE_PROVIDER_SITE_OTHER): Payer: 59 | Admitting: Family Medicine

## 2018-08-30 ENCOUNTER — Encounter: Payer: Self-pay | Admitting: Family Medicine

## 2018-08-30 VITALS — BP 129/79 | HR 79 | Temp 98.2°F | Resp 16 | Ht 63.5 in | Wt 208.0 lb

## 2018-08-30 DIAGNOSIS — B029 Zoster without complications: Secondary | ICD-10-CM

## 2018-08-30 MED ORDER — VALACYCLOVIR HCL 1 G PO TABS: 1000.0000 mg | ORAL_TABLET | Freq: Three times a day (TID) | ORAL | 0 refills | Status: DC

## 2018-08-30 MED ORDER — PREDNISONE 20 MG PO TABS: 40.0000 mg | ORAL_TABLET | Freq: Every day | ORAL | 0 refills | Status: DC

## 2018-08-30 NOTE — Patient Instructions (Addendum)
Use the valtrex as prescribed for 7 days. Lidocaine patches   Shingles Shingles is an infection that causes a painful skin rash and fluid-filled blisters. Shingles is caused by the same virus that causes chickenpox. Shingles only develops in people who:  Have had chickenpox.  Have gotten the chickenpox vaccine. (This is rare.)  The first symptoms of shingles may be itching, tingling, or pain in an area on your skin. A rash will follow in a few days or weeks. The rash is usually on one side of the body in a bandlike or beltlike pattern. Over time, the rash turns into fluid-filled blisters that break open, scab over, and dry up. Medicines may:  Help you manage pain.  Help you recover more quickly.  Help to prevent long-term problems.  Follow these instructions at home: Medicines  Take medicines only as told by your doctor.  Apply an anti-itch or numbing cream to the affected area as told by your doctor. Blister and Rash Care  Take a cool bath or put cool compresses on the area of the rash or blisters as told by your doctor. This may help with pain and itching.  Keep your rash covered with a loose bandage (dressing). Wear loose-fitting clothing.  Keep your rash and blisters clean with mild soap and cool water or as told by your doctor.  Check your rash every day for signs of infection. These include redness, swelling, and pain that lasts or gets worse.  Do not pick your blisters.  Do not scratch your rash. General instructions  Rest as told by your doctor.  Keep all follow-up visits as told by your doctor. This is important.  Until your blisters scab over, your infection can cause chickenpox in people who have never had it or been vaccinated against it. To prevent this from happening, avoid touching other people or being around other people, especially: ? Babies. ? Pregnant women. ? Children who have eczema. ? Elderly people who have transplants. ? People who have  chronic illnesses, such as leukemia or AIDS. Contact a doctor if:  Your pain does not get better with medicine.  Your pain does not get better after the rash heals.  Your rash looks infected. Signs of infection include: ? Redness. ? Swelling. ? Pain that lasts or gets worse. Get help right away if:  The rash is on your face or nose.  You have pain in your face, pain around your eye area, or loss of feeling on one side of your face.  You have ear pain or you have ringing in your ear.  You have loss of taste.  Your condition gets worse. This information is not intended to replace advice given to you by your health care provider. Make sure you discuss any questions you have with your health care provider. Document Released: 02/24/2008 Document Revised: 05/03/2016 Document Reviewed: 06/19/2014 Elsevier Interactive Patient Education  [employer].

## 2018-08-30 NOTE — Progress Notes (Signed)
Sarah Suarez , Aug 22, 1961, 56 y.o., female MRN: [id number] Patient Care Team    Relationship Specialty Notifications Start End  Ma Hillock, DO PCP - General Family Medicine  12/29/17   Sarah Mass, MD (Inactive)  Gynecology  12/29/17   Sarah Artist, MD Consulting Physician Gastroenterology  12/29/17   Sarah Elbe, MD Consulting Physician Alternative Medicine  12/29/17   Sarah Suarez, OD  Optometry  12/29/17     Chief Complaint  Patient presents with  . Rash    mid back, under left arm     Subjective: Pt presents for an OV with complaints of burning rash of 5 days duration.  Associated symptoms include new rash that started mid back. She reports she thought it was just a reaction to her bra since it right under the strap. She put cortisone on it and within 2 days the rash was painful. She then noticed another patch start under her left axilla. She does recall feeling fatigued and had a headache about a week ago. She has not had the shingle vaccine.  Her screenings are up to date and she has not been feeling particularly stressed.   Depression screen PHQ 2/9 12/29/2017  Decreased Interest 0  Down, Depressed, Hopeless 0  PHQ - 2 Score 0    No Known Allergies Social History   Tobacco Use  . Smoking status: Former Smoker    Types: Cigarettes    Last attempt to quit: 09/22/1979    Years since quitting: 38.9  . Smokeless tobacco: Never Used  . Tobacco comment: a little in high school  Substance Use Topics  . Alcohol use: No    Alcohol/week: 0.0 standard drinks   Past Medical History:  Diagnosis Date  . Allergy   . Chicken pox as a child  . GERD (gastroesophageal reflux disease)   . Hematuria   . Hyperlipidemia   . Kidney stone   . Neck problem 06/16/2012   Past Surgical History:  Procedure Laterality Date  . [place]  . laparoscopically assisted vaginal hysterectomy  1993   partial- still has ovaries  . TUBAL LIGATION    .  WISDOM TOOTH EXTRACTION  57 yrs old   Family History  Problem Relation Age of Onset  . Dementia Father 54  . Hyperlipidemia Father   . Hypertension Father   . Throat cancer Brother 50        smoke- remission?  . Alcohol abuse Brother   . Dementia Maternal Grandmother   . Heart attack Maternal Grandfather   . Cancer Paternal Grandfather        unsure of type  . Alcohol abuse Brother   . Alcohol abuse Brother   . Colon cancer Neg Hx   . Rectal cancer Neg Hx   . Esophageal cancer Neg Hx   . Stomach cancer Neg Hx    Allergies as of 08/30/2018   No Known Allergies     Medication List        Accurate as of 08/30/18 11:32 AM. Always use your most recent med list.          EC-RX TESTOSTERONE 0.4 % Crea Place onto the skin. Apply 2 to 3 clicks once daily-rotate site Off one week a month   fluticasone 50 MCG/ACT nasal spray Commonly known as:  FLONASE Place 2 sprays into both nostrils daily.   NONFORMULARY OR COMPOUNDED ITEM BIEST (estriol/estradiol) 5(2.5-2.5MG ) Apply 4 clicks once daily -  rotate sites  Off one week a month   progesterone 200 MG capsule Commonly known as:  PROMETRIUM Take 200 mg by mouth daily. Off one week a month   thyroid 32.5 MG tablet Commonly known as:  ARMOUR Take 1 tablet (32.5 mg total) by mouth daily.       All past medical history, surgical history, allergies, family history, immunizations andmedications were updated in the EMR today and reviewed under the history and medication portions of their EMR.     ROS: Negative, with the exception of above mentioned in HPI   Objective:  BP 129/79 (BP Location: Right Arm, Patient Position: Sitting, Cuff Size: Large)   Pulse 79   Temp 98.2 F (36.8 C) (Oral)   Resp 16   Ht 5' 3.5" (1.613 m)   Wt 208 lb (94.3 kg)   SpO2 98%   BMI 36.27 kg/m  Body Suarez index is 36.27 kg/m. Gen: Afebrile. No acute distress. Nontoxic in appearance, well developed, well nourished.  HENT: AT. [place].  MMM Eyes:Pupils Equal Round Reactive to light, Extraocular movements intact,  Conjunctiva without redness, discharge or icterus. Neck/lymp/endocrine: Supple,no lymphadenopathy Skin: plum sized erythema based rash with vesicles mid thoracic under bra w/ similar patsh under left axilla. No drainage noted.  No purpura or petechiae.  Neuro:  Normal gait. PERLA. EOMi. Alert. Oriented x3   No exam data present No results found. No results found for this or any previous visit (from the past 24 hour(s)).  Assessment/Plan: Sarah Suarez is a 57 y.o. female present for OV for   Herpes zoster without complication Exam consistent with shingles outbreak. Valtrex TID for 7 days. Prednisone burst provided.  lidocaine patches or cream OTC recommended.  - Avoid exposure to other individuals until blisters are resolved or scabbed. Avoidance of pregnant women.  - f/u 2 weeks if not resolved, sooner if worsening.    Reviewed expectations re: course of current medical issues.  Discussed self-management of symptoms.  Outlined signs and symptoms indicating need for more acute intervention.  Patient verbalized understanding and all questions were answered.  Patient received an After-Visit Summary.    No orders of the defined types were placed in this encounter.    Note is dictated utilizing voice recognition software. Although note has been proof read prior to signing, occasional typographical errors still can be missed. If any questions arise, please do not hesitate to call for verification.   electronically signed by:  Sarah Pouch, DO  [place]

## 2018-10-17 DIAGNOSIS — L821 Other seborrheic keratosis: Secondary | ICD-10-CM | POA: Diagnosis not present

## 2018-10-17 DIAGNOSIS — L820 Inflamed seborrheic keratosis: Secondary | ICD-10-CM | POA: Diagnosis not present

## 2018-10-17 DIAGNOSIS — Z1283 Encounter for screening for malignant neoplasm of skin: Secondary | ICD-10-CM | POA: Diagnosis not present

## 2018-10-26 ENCOUNTER — Ambulatory Visit: Payer: Self-pay

## 2018-10-26 ENCOUNTER — Encounter: Payer: Self-pay | Admitting: Family Medicine

## 2018-10-26 ENCOUNTER — Ambulatory Visit (INDEPENDENT_AMBULATORY_CARE_PROVIDER_SITE_OTHER): Payer: 59 | Admitting: Family Medicine

## 2018-10-26 VITALS — BP 142/86 | HR 72 | Temp 98.3°F | Resp 16 | Ht 63.5 in | Wt 208.2 lb

## 2018-10-26 DIAGNOSIS — B029 Zoster without complications: Secondary | ICD-10-CM

## 2018-10-26 DIAGNOSIS — I10 Essential (primary) hypertension: Secondary | ICD-10-CM | POA: Insufficient documentation

## 2018-10-26 DIAGNOSIS — R030 Elevated blood-pressure reading, without diagnosis of hypertension: Secondary | ICD-10-CM

## 2018-10-26 HISTORY — DX: Zoster without complications: B02.9

## 2018-10-26 MED ORDER — VALACYCLOVIR HCL 1 G PO TABS: 1000.0000 mg | ORAL_TABLET | Freq: Three times a day (TID) | ORAL | 0 refills | Status: DC

## 2018-10-26 MED ORDER — PREDNISONE 20 MG PO TABS: 40.0000 mg | ORAL_TABLET | Freq: Every day | ORAL | 0 refills | Status: DC

## 2018-10-26 NOTE — Progress Notes (Signed)
Sarah Suarez , 1960/11/01, 58 y.o., female MRN: [id number] Patient Care Team    Relationship Specialty Notifications Start End  Ma Hillock, DO PCP - General Family Medicine  12/29/17   Terrance Mass, MD (Inactive)  Gynecology  12/29/17   Ladene Artist, MD Consulting Physician Gastroenterology  12/29/17   Kerney Elbe, MD Consulting Physician Alternative Medicine  12/29/17   Marica Otter, OD  Optometry  12/29/17     Chief Complaint  Patient presents with  . Rash    Pt has rash that started yesterday on Left breast. Painful with no drainage      Subjective:  Sarah Suarez is a 58 y.o. female present for new onset rash of her left breast. She states this is a similar presentation to the shingles rash she had in December. Rash is burning and uncomfortable. She denies fever, chills, malaise or headache. She again denies stress and her screenings are up to date. Her mammogram over the summer was abnormal- however [place] follow up of right breast asymmetry  reported normal.  She denies any bowel changes, fatigue or night sweats.  Prior note:  Pt presents for an OV with complaints of burning rash of 5 days duration.  Associated symptoms include new rash that started mid back. She reports she thought it was just a reaction to her bra since it right under the strap. She put cortisone on it and within 2 days the rash was painful. She then noticed another patch start under her left axilla. She does recall feeling fatigued and had a headache about a week ago. She has not had the shingle vaccine.  Her screenings are up to date and she has not been feeling particularly stressed.   Depression screen PHQ 2/9 12/29/2017  Decreased Interest 0  Down, Depressed, Hopeless 0  PHQ - 2 Score 0    No Known Allergies Social History   Tobacco Use  . Smoking status: Former Smoker    Types: Cigarettes    Last attempt to quit: 09/22/1979    Years since quitting: 39.1  . Smokeless tobacco: Never  Used  . Tobacco comment: a little in high school  Substance Use Topics  . Alcohol use: No    Alcohol/week: 0.0 standard drinks   Past Medical History:  Diagnosis Date  . Allergy   . Chicken pox as a child  . GERD (gastroesophageal reflux disease)   . Hematuria   . Hyperlipidemia   . Kidney stone   . Neck problem 06/16/2012   Past Surgical History:  Procedure Laterality Date  . [place]  . laparoscopically assisted vaginal hysterectomy  1993   partial- still has ovaries  . TUBAL LIGATION    . WISDOM TOOTH EXTRACTION  58 yrs old   Family History  Problem Relation Age of Onset  . Dementia Father 78  . Hyperlipidemia Father   . Hypertension Father   . Throat cancer Brother 50        smoke- remission?  . Alcohol abuse Brother   . Dementia Maternal Grandmother   . Heart attack Maternal Grandfather   . Cancer Paternal Grandfather        unsure of type  . Alcohol abuse Brother   . Alcohol abuse Brother   . Colon cancer Neg Hx   . Rectal cancer Neg Hx   . Esophageal cancer Neg Hx   . Stomach cancer Neg Hx    Allergies as  of 10/26/2018   No Known Allergies     Medication List       Accurate as of October 26, 2018 12:01 PM. Always use your most recent med list.        EC-RX TESTOSTERONE 0.4 % Crea Place onto the skin. Apply 2 to 3 clicks once daily-rotate site Off one week a month   fluticasone 50 MCG/ACT nasal spray Commonly known as:  FLONASE Place 2 sprays into both nostrils daily.   NONFORMULARY OR COMPOUNDED ITEM BIEST (estriol/estradiol) 5(2.5-2.5MG ) Apply 4 clicks once daily - rotate sites  Off one week a month   predniSONE 20 MG tablet Commonly known as:  DELTASONE Take 2 tablets (40 mg total) by mouth daily with breakfast.   progesterone 200 MG capsule Commonly known as:  PROMETRIUM Take 200 mg by mouth daily. Off one week a month   thyroid 32.5 MG tablet Commonly known as:  NATURE-THROID Take 1 tablet (32.5 mg total) by  mouth daily.   valACYclovir 1000 MG tablet Commonly known as:  VALTREX Take 1 tablet (1,000 mg total) by mouth 3 (three) times daily.       All past medical history, surgical history, allergies, family history, immunizations andmedications were updated in the EMR today and reviewed under the history and medication portions of their EMR.     ROS: Negative, with the exception of above mentioned in HPI   Objective:  BP (!) 142/86 (BP Location: Right Arm, Patient Position: Sitting, Cuff Size: Normal)   Pulse 72   Temp 98.3 F (36.8 C) (Oral)   Resp 16   Ht 5' 3.5" (1.613 m)   Wt 208 lb 4 oz (94.5 kg)   SpO2 98%   BMI 36.31 kg/m  Body mass index is 36.31 kg/m. Gen: Afebrile. No acute distress. Nontoxic in appearance.  HENT: AT. . MMM.  Eyes:Pupils Equal Round Reactive to light, Extraocular movements intact,  Conjunctiva without redness, discharge or icterus. Skin: oval shaped patch of vesicles on erythremic base left inferior breast rash, no purpura or petechiae.  Neuro: Normal gait. PERLA. EOMi. Alert. Oriented x3 Psych: Normal affect, dress and demeanor. Normal speech. Normal thought content and judgment.    No exam data present No results found. No results found for this or any previous visit (from the past 24 hour(s)).  Assessment/Plan: Sarah Suarez is a 58 y.o. female present for OV for   Herpes zoster without complication - repeat shingles infection about 2 mos after initial.  Discussed f/u in 4 weeks and we will perform labs at that time CBC, HIV, hep.   Mam and colo UTD. No complaints.  Valtrex TID for 7 days. Prednisone burst provided.  lidocaine patches or cream OTC recommended.  - Avoid exposure to other individuals until blisters are resolved or scabbed. Avoidance of pregnant women.  - f/u 4 weeks if not resolved, sooner if worsening.   Elevated BP: - follow up in 4 weeks for check on bp with provider and will collect labs at that time as well (above). BP  elevated x2 today, may be secondary to discomfort.   Reviewed expectations re: course of current medical issues.  Discussed self-management of symptoms.  Outlined signs and symptoms indicating need for more acute intervention.  Patient verbalized understanding and all questions were answered.  Patient received an After-Visit Summary.    No orders of the defined types were placed in this encounter.  > 25 minutes spent with patient, >50% of time spent face  to face counseling and coordinating care.     Note is dictated utilizing voice recognition software. Although note has been proof read prior to signing, occasional typographical errors still can be missed. If any questions arise, please do not hesitate to call for verification.   electronically signed by:  Howard Pouch, DO  [place]

## 2018-10-26 NOTE — Patient Instructions (Addendum)
After shingles is completely resolved, I would recommend we collect labs to make sure your immunity is intact.   Follow up in 4 weeks with provider appt and we will check your blood pressure and labs that day.   Exam consistent with shingles outbreak. Valtrex TID for 7 days. Prednisone burst provided.  lidocaine patches or cream OTC recommended.  - Avoid exposure to other individuals until blisters are resolved or scabbed. Avoidance of pregnant women.     Shingles  Shingles is an infection. It gives you a painful skin rash and blisters that have fluid in them. Shingles is caused by the same germ (virus) that causes chickenpox. Shingles only happens in people who:  Have had chickenpox.  Have been given a shot of medicine (vaccine) to protect against chickenpox. Shingles is rare in this group. The first symptoms of shingles may be itching, tingling, or pain in an area on your skin. A rash will show on your skin a few days or weeks later. The rash is likely to be on one side of your body. The rash usually has a shape like a belt or a band. Over time, the rash turns into fluid-filled blisters. The blisters will break open, change into scabs, and dry up. Medicines may:  Help with pain and itching.  Help you get better sooner.  Help to prevent long-term problems. Follow these instructions at home: Medicines  Take over-the-counter and prescription medicines only as told by your doctor.  Put on an anti-itch cream or numbing cream where you have a rash, blisters, or scabs. Do this as told by your doctor. Helping with itching and discomfort   Put cold, wet cloths (cold compresses) on the area of the rash or blisters as told by your doctor.  Cool baths can help you feel better. Try adding baking soda or dry oatmeal to the water to lessen itching. Do not bathe in hot water. Blister and rash care  Keep your rash covered with a loose bandage (dressing).  Wear loose clothing that does not  rub on your rash.  Keep your rash and blisters clean. To do this, wash the area with mild soap and cool water as told by your doctor.  Check your rash every day for signs of infection. Check for: ? More redness, swelling, or pain. ? Fluid or blood. ? Warmth. ? Pus or a bad smell.  Do not scratch your rash. Do not pick at your blisters. To help you to not scratch: ? Keep your fingernails clean and cut short. ? Wear gloves or mittens when you sleep, if scratching is a problem. General instructions  Rest as told by your doctor.  Keep all follow-up visits as told by your doctor. This is important.  Wash your hands often with soap and water. If soap and water are not available, use hand sanitizer. Doing this lowers your chance of getting a skin infection caused by germs (bacteria).  Your infection can cause chickenpox in people who have never had chickenpox or never got a shot of chickenpox vaccine. If you have blisters that did not change into scabs yet, try not to touch other people or be around other people, especially: ? Babies. ? Pregnant women. ? Children who have areas of red, itchy, or rough skin (eczema). ? Very old people who have transplants. ? People who have a long-term (chronic) sickness, like cancer or AIDS. Contact a doctor if:  Your pain does not get better with medicine.  Your pain  does not get better after the rash heals.  You have any signs of infection in the rash area. These signs include: ? More redness, swelling, or pain around the rash. ? Fluid or blood coming from the rash. ? The rash area feeling warm to the touch. ? Pus or a bad smell coming from the rash. Get help right away if:  The rash is on your face or nose.  You have pain in your face or pain by your eye.  You lose feeling on one side of your face.  You have trouble seeing.  You have ear pain, or you have ringing in your ear.  You have a loss of taste.  Your condition gets  worse. Summary  Shingles gives you a painful skin rash and blisters that have fluid in them.  Shingles is an infection. It is caused by the same germ (virus) that causes chickenpox.  Keep your rash covered with a loose bandage (dressing). Wear loose clothing that does not rub on your rash.  If you have blisters that did not change into scabs yet, try not to touch other people or be around people. This information is not intended to replace advice given to you by your health care provider. Make sure you discuss any questions you have with your health care provider. Document Released: 02/24/2008 Document Revised: 05/12/2017 Document Reviewed: 05/12/2017 Elsevier Interactive Patient Education  2019 [employer].

## 2018-10-26 NOTE — Telephone Encounter (Signed)
Pt c/o red bumpy rash to the left breast. Pt stated the rash is red and the bumps are red. Pt c/o moderate tingling pain and itching. Pt stated that she is having a "tingly" sensation to the left outer eye. Pt h/o shingles in December 2019 (see OV note 08/30/18). Pt given care advice and pt verbalized understanding. Appt made for this morning with PCP.   Reason for Disposition . [1] Localized rash is very painful AND [2] no fever  Answer Assessment - Initial Assessment Questions 1. APPEARANCE of RASH: "Describe the rash."      Red rash and red bumps  2. LOCATION: "Where is the rash located?"      Left breast  3. NUMBER: "How many spots are there?"      1 4. SIZE: "How big are the spots?" (Inches, centimeters or compare to size of a coin)      2 inches long 5. ONSET: "When did the rash start?"      Last night 6. ITCHING: "Does the rash itch?" If so, ask: "How bad is the itch?"  (Scale 1-10; or mild, moderate, severe)     Yes moderate 7. PAIN: "Does the rash hurt?" If so, ask: "How bad is the pain?"  (Scale 1-10; or mild, moderate, severe)     Tingling feeling- moderate 8. OTHER SYMPTOMS: "Do you have any other symptoms?" (e.g., fever)     Tingly spots close to left eye (outer) 9. PREGNANCY: "Is there any chance you are pregnant?" "When was your last menstrual period?"     n/a  Protocols used: RASH OR REDNESS - LOCALIZED-A-AH

## 2018-12-07 ENCOUNTER — Ambulatory Visit (INDEPENDENT_AMBULATORY_CARE_PROVIDER_SITE_OTHER): Payer: 59 | Admitting: Family Medicine

## 2018-12-07 ENCOUNTER — Other Ambulatory Visit: Payer: Self-pay

## 2018-12-07 ENCOUNTER — Encounter: Payer: Self-pay | Admitting: Family Medicine

## 2018-12-07 VITALS — BP 138/80 | HR 73 | Temp 98.2°F | Resp 16 | Ht 64.0 in | Wt 209.0 lb

## 2018-12-07 DIAGNOSIS — I10 Essential (primary) hypertension: Secondary | ICD-10-CM | POA: Diagnosis not present

## 2018-12-07 DIAGNOSIS — B029 Zoster without complications: Secondary | ICD-10-CM | POA: Diagnosis not present

## 2018-12-07 DIAGNOSIS — E782 Mixed hyperlipidemia: Secondary | ICD-10-CM | POA: Diagnosis not present

## 2018-12-07 DIAGNOSIS — B999 Unspecified infectious disease: Secondary | ICD-10-CM | POA: Diagnosis not present

## 2018-12-07 MED ORDER — HYDROCHLOROTHIAZIDE 25 MG PO TABS: 25.0000 mg | ORAL_TABLET | Freq: Every day | ORAL | 1 refills | Status: DC

## 2018-12-07 MED ORDER — VALACYCLOVIR HCL 1 G PO TABS: 1000.0000 mg | ORAL_TABLET | Freq: Three times a day (TID) | ORAL | 0 refills | Status: DC

## 2018-12-07 NOTE — Patient Instructions (Signed)
Valtrex start again.  Start HCTZ daily in the morning for BP.  Low sodium/salt , continue to exercise.    Shingles  Shingles is an infection. It gives you a painful skin rash and blisters that have fluid in them. Shingles is caused by the same germ (virus) that causes chickenpox. Shingles only happens in people who:  Have had chickenpox.  Have been given a shot of medicine (vaccine) to protect against chickenpox. Shingles is rare in this group. The first symptoms of shingles may be itching, tingling, or pain in an area on your skin. A rash will show on your skin a few days or weeks later. The rash is likely to be on one side of your body. The rash usually has a shape like a belt or a band. Over time, the rash turns into fluid-filled blisters. The blisters will break open, change into scabs, and dry up. Medicines may:  Help with pain and itching.  Help you get better sooner.  Help to prevent long-term problems. Follow these instructions at home: Medicines  Take over-the-counter and prescription medicines only as told by your doctor.  Put on an anti-itch cream or numbing cream where you have a rash, blisters, or scabs. Do this as told by your doctor. Helping with itching and discomfort   Put cold, wet cloths (cold compresses) on the area of the rash or blisters as told by your doctor.  Cool baths can help you feel better. Try adding baking soda or dry oatmeal to the water to lessen itching. Do not bathe in hot water. Blister and rash care  Keep your rash covered with a loose bandage (dressing).  Wear loose clothing that does not rub on your rash.  Keep your rash and blisters clean. To do this, wash the area with mild soap and cool water as told by your doctor.  Check your rash every day for signs of infection. Check for: ? More redness, swelling, or pain. ? Fluid or blood. ? Warmth. ? Pus or a bad smell.  Do not scratch your rash. Do not pick at your blisters. To help you  to not scratch: ? Keep your fingernails clean and cut short. ? Wear gloves or mittens when you sleep, if scratching is a problem. General instructions  Rest as told by your doctor.  Keep all follow-up visits as told by your doctor. This is important.  Wash your hands often with soap and water. If soap and water are not available, use hand sanitizer. Doing this lowers your chance of getting a skin infection caused by germs (bacteria).  Your infection can cause chickenpox in people who have never had chickenpox or never got a shot of chickenpox vaccine. If you have blisters that did not change into scabs yet, try not to touch other people or be around other people, especially: ? Babies. ? Pregnant women. ? Children who have areas of red, itchy, or rough skin (eczema). ? Very old people who have transplants. ? People who have a long-term (chronic) sickness, like cancer or AIDS. Contact a doctor if:  Your pain does not get better with medicine.  Your pain does not get better after the rash heals.  You have any signs of infection in the rash area. These signs include: ? More redness, swelling, or pain around the rash. ? Fluid or blood coming from the rash. ? The rash area feeling warm to the touch. ? Pus or a bad smell coming from the rash. Get help  right away if:  The rash is on your face or nose.  You have pain in your face or pain by your eye.  You lose feeling on one side of your face.  You have trouble seeing.  You have ear pain, or you have ringing in your ear.  You have a loss of taste.  Your condition gets worse. Summary  Shingles gives you a painful skin rash and blisters that have fluid in them.  Shingles is an infection. It is caused by the same germ (virus) that causes chickenpox.  Keep your rash covered with a loose bandage (dressing). Wear loose clothing that does not rub on your rash.  If you have blisters that did not change into scabs yet, try not to  touch other people or be around people. This information is not intended to replace advice given to you by your health care provider. Make sure you discuss any questions you have with your health care provider. Document Released: 02/24/2008 Document Revised: 05/12/2017 Document Reviewed: 05/12/2017 Elsevier Interactive Patient Education  2019 [employer].

## 2018-12-07 NOTE — Progress Notes (Signed)
Sarah Suarez , June 09, 1961, 58 y.o., female MRN: [id number] Patient Care Team    Relationship Specialty Notifications Start End  Ma Hillock, DO PCP - General Family Medicine  12/29/17   Terrance Mass, MD (Inactive)  Gynecology  12/29/17   Ladene Artist, MD Consulting Physician Gastroenterology  12/29/17   Kerney Elbe, MD Consulting Physician Alternative Medicine  12/29/17   Marica Otter, OD  Optometry  12/29/17     Chief Complaint  Patient presents with  . Follow-up    Pt believes she has a shingles flare up that is getting ready to start. Left breast      Subjective:  Sarah Suarez is a 58 y.o.  Pt returns today for follow up. She has had two shingles outbreaks on her left breast  Since 08/2018 and both responded quickly to valtrex and prednisone. She returns today for followup and states she noticed last night tingling in this area again and today there is one small red spot again.  Prior note:  female present for new onset rash of her left breast. She states this is a similar presentation to the shingles rash she had in December. Rash is burning and uncomfortable. She denies fever, chills, malaise or headache. She again denies stress and her screenings are up to date. Her mammogram over the summer was abnormal- however [place] follow up of right breast asymmetry  reported normal.  She denies any bowel changes, fatigue or night sweats.  Prior note:  Pt presents for an OV with complaints of burning rash of 5 days duration.  Associated symptoms include new rash that started mid back. She reports she thought it was just a reaction to her bra since it right under the strap. She put cortisone on it and within 2 days the rash was painful. She then noticed another patch start under her left axilla. She does recall feeling fatigued and had a headache about a week ago. She has not had the shingle vaccine.  Her screenings are up to date and she has not been feeling particularly  stressed.   Elevated BP--> hypertension/HLD/obesity: Blood pressures over last year have been borderline to high. . Blood pressures ranges at home are not checked. Patient denies chest pain, shortness of breath or lower extremity edema. She exercise everyday and watches her diet. RF:  Fhx HD, HLD, obesity  Depression screen [facility] 2/9 12/29/2017  Decreased Interest 0  Down, Depressed, Hopeless 0  PHQ - 2 Score 0    No Known Allergies Social History   Tobacco Use  . Smoking status: Former Smoker    Types: Cigarettes    Last attempt to quit: 09/22/1979    Years since quitting: 39.2  . Smokeless tobacco: Never Used  . Tobacco comment: a little in high school  Substance Use Topics  . Alcohol use: No    Alcohol/week: 0.0 standard drinks   Past Medical History:  Diagnosis Date  . Allergy   . Chicken pox as a child  . GERD (gastroesophageal reflux disease)   . Hematuria   . Hyperlipidemia   . Kidney stone   . Neck problem 06/16/2012   Past Surgical History:  Procedure Laterality Date  . [place]  . laparoscopically assisted vaginal hysterectomy  1993   partial- still has ovaries  . TUBAL LIGATION    . WISDOM TOOTH EXTRACTION  58 yrs old   Family History  Problem Relation Age of Onset  .  Dementia Father 81  . Hyperlipidemia Father   . Hypertension Father   . Throat cancer Brother 50        smoke- remission?  . Alcohol abuse Brother   . Dementia Maternal Grandmother   . Heart attack Maternal Grandfather   . Cancer Paternal Grandfather        unsure of type  . Alcohol abuse Brother   . Alcohol abuse Brother   . Colon cancer Neg Hx   . Rectal cancer Neg Hx   . Esophageal cancer Neg Hx   . Stomach cancer Neg Hx    Allergies as of 12/07/2018   No Known Allergies     Medication List       Accurate as of December 07, 2018  8:55 AM. Always use your most recent med list.        fluticasone 50 MCG/ACT nasal spray Commonly known as:  FLONASE Place 2  sprays into both nostrils daily.   thyroid 32.5 MG tablet Commonly known as:  Nature-Throid Take 1 tablet (32.5 mg total) by mouth daily.   valACYclovir 1000 MG tablet Commonly known as:  VALTREX Take 1 tablet (1,000 mg total) by mouth 3 (three) times daily.       All past medical history, surgical history, allergies, family history, immunizations andmedications were updated in the EMR today and reviewed under the history and medication portions of their EMR.     ROS: Negative, with the exception of above mentioned in HPI   Objective:  BP 138/80 (BP Location: Left Arm, Patient Position: Sitting, Cuff Size: Normal)   Pulse 73   Temp 98.2 F (36.8 C) (Oral)   Resp 16   Ht 5\' 4"  (1.626 m)   Wt 209 lb (94.8 kg)   SpO2 100%   BMI 35.87 kg/m  Body mass index is 35.87 kg/m. Gen: Afebrile. No acute distress. Nontoxic in appearance.  HENT: AT. [place]. MMM. Eyes:Pupils Equal Round Reactive to light, Extraocular movements intact,  Conjunctiva without redness, discharge or icterus. Neck/lymp/endocrine: Supple,no lymphadenopathy, no thyromegaly CV: RRR no murmur, no edema, +2/4 P posterior tibialis pulses Chest: CTAB, no wheeze or crackles Abd: Soft. obese. NTND. BS present. no Masses palpated.  Skin: x2 small red raised areas left breast, no vesicles this time.  No purpura or petechiae.  Neuro:  Normal gait. PERLA. EOMi. Alert. Oriented x3.Marland Kitchen Psych: Normal affect, dress and demeanor. Normal speech. Normal thought content and judgment.   No exam data present No results found. No results found for this or any previous visit (from the past 24 hour(s)).  Assessment/Plan: Sarah Suarez is a 58 y.o. female present for OV for   Herpes zoster without complication/recurrent infections - Immunity work up given her repeat "infections or shingles" over the last 4 months.She does work out daily consider heat or yeast- however rash has always been consistent with tingling sensation symptoms  following by vesicle formation.  Mam and colo UTD. No complaints.  - cbc, cmp, acute hep, hiv, tsh ordered today. Valtrex TID for 7 days again. Monitor area without prednisone to rule out prednisone was not treating a rash other than shingles.  lidocaine patches or cream OTC recommended.  - Avoid exposure to other individuals until blisters are resolved or scabbed. Avoidance of pregnant women.  - f/u 4 weeks if not resolved, sooner if worsening.   HTN/HLD/obesity:  - BP still borderline.  - start HCTZ 25 mg QD.  - low sodium diet, continue to exercise.  -  Labs due after 02/2019--> has high cholesterol- no meds as of yet. Working on diet and exercise (completed by GYN).    Reviewed expectations re: course of current medical issues.  Discussed self-management of symptoms.  Outlined signs and symptoms indicating need for more acute intervention.  Patient verbalized understanding and all questions were answered.  Patient received an After-Visit Summary.    No orders of the defined types were placed in this encounter.  > 25 minutes spent with patient, >50% of time spent face to face counseling and coordinating care.     Note is dictated utilizing voice recognition software. Although note has been proof read prior to signing, occasional typographical errors still can be missed. If any questions arise, please do not hesitate to call for verification.   electronically signed by:  Howard Pouch, DO  [place]

## 2018-12-08 ENCOUNTER — Other Ambulatory Visit (INDEPENDENT_AMBULATORY_CARE_PROVIDER_SITE_OTHER): Payer: 59

## 2018-12-08 DIAGNOSIS — I10 Essential (primary) hypertension: Secondary | ICD-10-CM | POA: Diagnosis not present

## 2018-12-08 DIAGNOSIS — B999 Unspecified infectious disease: Secondary | ICD-10-CM | POA: Diagnosis not present

## 2018-12-08 LAB — CBC WITH DIFFERENTIAL/PLATELET
Basophils Absolute: 0 10*3/uL (ref 0.0–0.1)
Basophils Relative: 0.4 % (ref 0.0–3.0)
Eosinophils Absolute: 0.1 10*3/uL (ref 0.0–0.7)
Eosinophils Relative: 1.8 % (ref 0.0–5.0)
HCT: 40.7 % (ref 36.0–46.0)
Hemoglobin: 13.6 g/dL (ref 12.0–15.0)
LYMPHS PCT: 35 % (ref 12.0–46.0)
Lymphs Abs: 2.4 10*3/uL (ref 0.7–4.0)
MCHC: 33.4 g/dL (ref 30.0–36.0)
MCV: 94.1 fl (ref 78.0–100.0)
Monocytes Absolute: 0.5 10*3/uL (ref 0.1–1.0)
Monocytes Relative: 7 % (ref 3.0–12.0)
NEUTROS ABS: 3.8 10*3/uL (ref 1.4–7.7)
Neutrophils Relative %: 55.8 % (ref 43.0–77.0)
Platelets: 264 10*3/uL (ref 150.0–400.0)
RBC: 4.32 Mil/uL (ref 3.87–5.11)
RDW: 13.4 % (ref 11.5–15.5)
WBC: 6.9 10*3/uL (ref 4.0–10.5)

## 2018-12-08 LAB — COMPREHENSIVE METABOLIC PANEL
ALT: 12 U/L (ref 0–35)
AST: 13 U/L (ref 0–37)
Albumin: 4.3 g/dL (ref 3.5–5.2)
Alkaline Phosphatase: 89 U/L (ref 39–117)
BUN: 12 mg/dL (ref 6–23)
CO2: 29 mEq/L (ref 19–32)
CREATININE: 0.75 mg/dL (ref 0.40–1.20)
Calcium: 9.4 mg/dL (ref 8.4–10.5)
Chloride: 101 mEq/L (ref 96–112)
GFR: 79.55 mL/min (ref >60.00)
Glucose, Bld: 79 mg/dL (ref 70–99)
Potassium: 4 mEq/L (ref 3.5–5.1)
Sodium: 137 mEq/L (ref 135–145)
Total Bilirubin: 0.5 mg/dL (ref 0.2–1.2)
Total Protein: 6.7 g/dL (ref 6.0–8.3)

## 2018-12-08 LAB — TSH: TSH: 1.82 u[IU]/mL (ref 0.35–4.50)

## 2018-12-09 ENCOUNTER — Telehealth: Payer: Self-pay | Admitting: Family Medicine

## 2018-12-09 LAB — HEPATITIS PANEL, ACUTE
HEP C AB: NONREACTIVE
Hep A IgM: NONREACTIVE
Hep B C IgM: NONREACTIVE
Hepatitis B Surface Ag: NONREACTIVE
SIGNAL TO CUT-OFF: 0.01 (ref <1.00)

## 2018-12-09 LAB — HIV ANTIBODY (ROUTINE TESTING W REFLEX): HIV 1&2 Ab, 4th Generation: NONREACTIVE

## 2018-12-09 NOTE — Telephone Encounter (Signed)
Patient advised of normal results, voiced understanding.  Unable to schedule 4 week appt at this time, she will CB at later date.

## 2018-12-09 NOTE — Telephone Encounter (Signed)
Please inform patient the following information: All her labs are normal. Normal cell counts, thyroid function, sugar and negative hepatitis and HIV (infectous disease).  Continue the valtrex tx for shingles.  F/U 4 weeks, sooner if rash is worsening.

## 2019-03-24 ENCOUNTER — Other Ambulatory Visit: Payer: Self-pay | Admitting: Obstetrics and Gynecology

## 2019-03-28 NOTE — Telephone Encounter (Signed)
Please let the patient know that one month of her thyroid medication was sent. Since her primary is checking her labs, including TSH, it makes the most sense for her to manage her thyroid medication. I will forward this to her primary. Please have the patient contact her.

## 2019-03-28 NOTE — Telephone Encounter (Signed)
Medication refill request: Nature-throid Last AEX:  03/14/18 Next AEX: Called patient left message to scheduleAEX  Last MMG (if hormonal medication request): 04/18/18 Bi-rads 1 neg  Refill authorized: #[id number] with 2 RF

## 2019-04-23 ENCOUNTER — Other Ambulatory Visit: Payer: Self-pay | Admitting: Obstetrics and Gynecology

## 2019-04-24 NOTE — Telephone Encounter (Signed)
Rx denied.  Per Dr. Talbert Nan PCP needs to manage Rx

## 2019-05-11 ENCOUNTER — Telehealth: Payer: Self-pay | Admitting: Family Medicine

## 2019-05-11 NOTE — Telephone Encounter (Signed)
Called and spoke with patient and she stated she would like Dr Raoul Pitch to start taking over her Thyroid medication. She has appt 05/19/2019 for BP and to take over thyroid medication. Labs completed 11/2018. Pt was advised to call MD who was RXing this medication to call in enough to last until appt with with Dr Raoul Pitch. Pt states she did not go to the F/U appt with her so she will not send in medication.   Pt was advised Dr Raoul Pitch is out of the office and this could not be sent to pharmacy. Pt asked if message could be sent to Dr K to send in in enough medication to last until appt. Unable to move appt up due to know available appts

## 2019-05-11 NOTE — Telephone Encounter (Signed)
Patient is requesting call back to discuss thyroid blood tests. She would Dr. Raoul Pitch to take over her thyroid Rx. Patient made an appointment but would like CMA to call back to discuss whether any tests are in her chart.

## 2019-05-15 MED ORDER — THYROID 32.5 MG PO TABS: 32.5000 mg | ORAL_TABLET | Freq: Every day | ORAL | 0 refills | Status: DC

## 2019-05-15 NOTE — Addendum Note (Signed)
Addended by: Howard Pouch A on: 05/15/2019 12:44 PM   Modules accepted: Orders

## 2019-05-15 NOTE — Telephone Encounter (Signed)
Pt was called and does not have VM set up. My chart message was sent asking pt for Medication, dose, and frequency.

## 2019-05-15 NOTE — Telephone Encounter (Signed)
Please inform patient the following information: I refilled her thyroid med per dose we had in the system last. Please confirm it with her.

## 2019-05-16 NOTE — Telephone Encounter (Signed)
PA for Nature-Throid was approved on 05/16/19.

## 2019-05-16 NOTE — Telephone Encounter (Signed)
Started and sent a PA through [employer].   Key: [id number] PA Case ID: [id number]  Medication: Nature-Throid 32.5mg  tabs   Will check the status in 24-48 hours.

## 2019-05-16 NOTE — Telephone Encounter (Signed)
Called pt and went to VM that has not been set up yet. Was unable to leave message. Did receive prior auth paperwork for patients thyroid medication from pharmacy.

## 2019-05-19 ENCOUNTER — Encounter: Payer: Self-pay | Admitting: Family Medicine

## 2019-05-19 ENCOUNTER — Other Ambulatory Visit: Payer: Self-pay

## 2019-05-19 ENCOUNTER — Ambulatory Visit (INDEPENDENT_AMBULATORY_CARE_PROVIDER_SITE_OTHER): Payer: 59 | Admitting: Family Medicine

## 2019-05-19 VITALS — BP 142/86 | HR 104 | Temp 98.2°F | Resp 16 | Ht 64.0 in | Wt 213.4 lb

## 2019-05-19 DIAGNOSIS — I10 Essential (primary) hypertension: Secondary | ICD-10-CM | POA: Diagnosis not present

## 2019-05-19 DIAGNOSIS — E039 Hypothyroidism, unspecified: Secondary | ICD-10-CM

## 2019-05-19 DIAGNOSIS — E782 Mixed hyperlipidemia: Secondary | ICD-10-CM

## 2019-05-19 DIAGNOSIS — R109 Unspecified abdominal pain: Secondary | ICD-10-CM

## 2019-05-19 MED ORDER — LISINOPRIL-HYDROCHLOROTHIAZIDE 10-12.5 MG PO TABS: 1.0000 | ORAL_TABLET | Freq: Every day | ORAL | 1 refills | Status: DC

## 2019-05-19 MED ORDER — THYROID 32.5 MG PO TABS: 32.5000 mg | ORAL_TABLET | Freq: Every day | ORAL | 3 refills | Status: DC

## 2019-05-19 NOTE — Progress Notes (Signed)
Sarah Suarez , 1961-09-14, 58 y.o., female MRN: [id number] Patient Care Team    Relationship Specialty Notifications Start End  Ma Hillock, DO PCP - General Family Medicine  12/29/17   Terrance Mass, MD (Inactive)  Gynecology  12/29/17   Ladene Artist, MD Consulting Physician Gastroenterology  12/29/17   Kerney Elbe, MD Consulting Physician Alternative Medicine  12/29/17   Marica Otter, OD  Optometry  12/29/17     Chief Complaint  Patient presents with  . Hypertension    took bp medication this morning. doesnt check bp at home.      Subjective:  Sarah Suarez is a 58 y.o.  hypertension/HLD/obesity: Blood pressures over last year have been borderline to high.  She reports compliance with HCTZ 25 mg daily.. Blood pressures ranges at home are not checked. Patient denies chest pain, shortness of breath, dizziness or lower extremity edema.  She exercise everyday and watches her diet. RF:  Fhx HD, HLD, obesity CBC, CMP, TSH completed 11/2018.  Hypothyroid:  Compliant with nature-throid 32.5. TSH normal 11/2018.  Abdominal pain: Patient reports she has had 2 episodes of abdominal pain which caused nausea and vomiting.  The first episode was 2-1/2 months ago followed by a second episode 4 weeks ago.  Patient has had no symptoms for at least 4 weeks.  She states both occurrences have been in the evening or middle the night.  She had to vomit rather extensively on both occasions.  She felt extremely weak after nausea and vomit and was laying on the floor and "could not breathe "-secondary to abdominal pain.  She states the pain is in her epigastric/right upper quadrant area.  She has a history of kidney stones.  Depression screen [facility] 2/9 05/19/2019 12/29/2017  Decreased Interest 0 0  Down, Depressed, Hopeless 0 0  PHQ - 2 Score 0 0    No Known Allergies Social History   Tobacco Use  . Smoking status: Former Smoker    Types: Cigarettes    Quit date: 09/22/1979    Years  since quitting: 39.6  . Smokeless tobacco: Never Used  . Tobacco comment: a little in high school  Substance Use Topics  . Alcohol use: No    Alcohol/week: 0.0 standard drinks   Past Medical History:  Diagnosis Date  . Allergy   . Chicken pox as a child  . GERD (gastroesophageal reflux disease)   . Hematuria   . Hyperlipidemia   . Hypertension   . Kidney stone   . Neck problem 06/16/2012   Past Surgical History:  Procedure Laterality Date  . [place]  . laparoscopically assisted vaginal hysterectomy  1993   partial- still has ovaries  . TUBAL LIGATION    . WISDOM TOOTH EXTRACTION  58 yrs old   Family History  Problem Relation Age of Onset  . Dementia Father 48  . Hyperlipidemia Father   . Hypertension Father   . Throat cancer Brother 50        smoke- remission?  . Alcohol abuse Brother   . Dementia Maternal Grandmother   . Heart attack Maternal Grandfather   . Cancer Paternal Grandfather        unsure of type  . Alcohol abuse Brother   . Alcohol abuse Brother   . Colon cancer Neg Hx   . Rectal cancer Neg Hx   . Esophageal cancer Neg Hx   . Stomach cancer Neg Hx  Allergies as of 05/19/2019   No Known Allergies     Medication List       Accurate as of May 19, 2019  1:44 PM. If you have any questions, ask your nurse or doctor.        fluticasone 50 MCG/ACT nasal spray Commonly known as: FLONASE Place 2 sprays into both nostrils daily.   hydrochlorothiazide 25 MG tablet Commonly known as: HYDRODIURIL Take 1 tablet (25 mg total) by mouth daily.   thyroid 32.5 MG tablet Commonly known as: Nature-Throid Take 1 tablet (32.5 mg total) by mouth daily.   valACYclovir 1000 MG tablet Commonly known as: VALTREX Take 1 tablet (1,000 mg total) by mouth 3 (three) times daily.       All past medical history, surgical history, allergies, family history, immunizations andmedications were updated in the EMR today and reviewed under the  history and medication portions of their EMR.     ROS: Negative, with the exception of above mentioned in HPI   Objective:  BP (!) 142/86 (BP Location: Left Arm, Patient Position: Sitting, Cuff Size: Large)   Pulse (!) 104   Temp 98.2 F (36.8 C) (Temporal)   Resp 16   Ht 5\' 4"  (1.626 m)   Wt 213 lb 6 oz (96.8 kg)   SpO2 98%   BMI 36.63 kg/m  Body mass index is 36.63 kg/m. Gen: Afebrile. No acute distress.  HENT: AT. [place]. MMM.  Eyes:Pupils Equal Round Reactive to light, Extraocular movements intact,  Conjunctiva without redness, discharge or icterus. Neck/lymp/endocrine: Supple,no lymphadenopathy, no thyromegaly CV: RRR no murmur, no edema, +2/4 P posterior tibialis pulses Chest: CTAB, no wheeze or crackles Abd: Soft. NTND. BS present.  Neuro: Normal gait. PERLA. EOMi. Alert. Oriented. Psych: Normal affect, dress and demeanor. Normal speech. Normal thought content and judgment.  No exam data present No results found. No results found for this or any previous visit (from the past 24 hour(s)).  Assessment/Plan: Sarah Suarez is a 58 y.o. female present for OV for   HTN/HLD/obesity:  - BP still borderline.  -Stop HCTZ 25 mg QD.  -Start lisinopril-HCTZ 10-12.5 mg daily - low sodium diet, continue to exercise.  -Lipid panel collected today.  Hypothyroid:  TSH NL 12/08/2018 normal.  Refilled nature thyroid 32.5 mg daily.  Yearly follow-up with CPE.  Nausea/vomit/abdominal pain: - DDX pancreatits, cardiac, GB or gastris related.  Discussed these with her today she currently does not have any pain.  Advised her that when she does have pain to call in immediately so that we can work her in either here or at another location if possible.  It is important that we see her when the pain is occurring in order to evaluate.  She has not had any pain for 4 weeks.   Reviewed expectations re: course of current medical issues.  Discussed self-management of symptoms.  Outlined signs and  symptoms indicating need for more acute intervention.  Patient verbalized understanding and all questions were answered.  Patient received an After-Visit Summary.    No orders of the defined types were placed in this encounter.  > 25 minutes spent with patient, >50% of time spent face to face counseling and coordinating care.     Note is dictated utilizing voice recognition software. Although note has been proof read prior to signing, occasional typographical errors still can be missed. If any questions arise, please do not hesitate to call for verification.   electronically signed by:  Howard Pouch,  DO  [facility] [place]

## 2019-05-19 NOTE — Patient Instructions (Signed)
If you get the abdominal pain again- please make note of the food you may have eaten prior and be seen immediately.  This could be gallbladder or even heart conditions can cause similar symptoms in women.   I have refilled your meds and changed the BP pill to a combination pill. Stop the prior HCTZ.   Monitor BP- if > 135/85 please be seen. If it is normal>> follow up in 6 months.

## 2019-05-20 LAB — LIPID PANEL
Cholesterol: 258 mg/dL — ABNORMAL HIGH (ref <200)
HDL: 55 mg/dL (ref >=50)
LDL Cholesterol (Calc): 168 mg/dL (calc) — ABNORMAL HIGH
Non-HDL Cholesterol (Calc): 203 mg/dL (calc) — ABNORMAL HIGH (ref <130)
Total CHOL/HDL Ratio: 4.7 (calc) (ref <5.0)
Triglycerides: 193 mg/dL — ABNORMAL HIGH (ref <150)

## 2019-05-22 ENCOUNTER — Telehealth: Payer: Self-pay | Admitting: Family Medicine

## 2019-05-22 DIAGNOSIS — R109 Unspecified abdominal pain: Secondary | ICD-10-CM | POA: Insufficient documentation

## 2019-05-22 DIAGNOSIS — E039 Hypothyroidism, unspecified: Secondary | ICD-10-CM | POA: Insufficient documentation

## 2019-05-22 NOTE — Telephone Encounter (Signed)
Tried calling patient and was unable to lvm due to it not being set up. Will try calling again later

## 2019-05-22 NOTE — Telephone Encounter (Signed)
Please inform patient the following information: Her cholesterol is higher than desired. With her fhx and her her h/o of HTN and BMI >30 (height/weight/gender calculated). She would benefit from a cholesterol-lowering medication called a statin.  She will hopefully only need a low dose.  This medication helps lower cholesterol but also provides added cardiovascular protection against heart attacks and strokes. I would recommend a mediterranean diet and regular exercise.  A mediterranean diet is high in fruits, vegetables, whole grains, fish, chicken, nuts, healthy fats (olive oil or canola oil). Low fat dairy. There are many online resources and books on this diet. Limit butter, margarine, red meat and sweets.   If she is agreeable to try the statin I will call this in for her, and she will follow-up in 3 months with provider to see how she is tolerating the medication and repeat cholesterol labs.  They should be fasting labs as well (drinking only water for 6-8 hours prior to collection)  If she is agreeable please schedule her for the 81-month follow-up and advise me so I can send in the medication for her.

## 2019-05-23 MED ORDER — ATORVASTATIN CALCIUM 20 MG PO TABS: 20.0000 mg | ORAL_TABLET | Freq: Every day | ORAL | 1 refills | Status: DC

## 2019-05-23 NOTE — Telephone Encounter (Signed)
Pt does want to start a low dose statin medication. [facility] [place].

## 2019-05-23 NOTE — Telephone Encounter (Signed)
Pt was given instructions to take at bedtime.

## 2019-05-23 NOTE — Telephone Encounter (Signed)
Pt was called and given lab results/instructions She did not want to schedule F/U appt at this time and said she would call back

## 2019-05-23 NOTE — Addendum Note (Signed)
Addended by: Howard Pouch A on: 05/23/2019 04:50 PM   Modules accepted: Orders

## 2019-05-23 NOTE — Telephone Encounter (Signed)
Lipitor prescribed. Take before bed.

## 2019-06-16 ENCOUNTER — Telehealth: Payer: Self-pay

## 2019-06-16 NOTE — Telephone Encounter (Signed)
I'm sorry, it is her Nature-throid

## 2019-06-16 NOTE — Telephone Encounter (Signed)
More information is needed. Name of medicine?

## 2019-06-16 NOTE — Telephone Encounter (Signed)
Pt called and LM stating that her medication has been recalled and she will need something else sent to pharmacy. Please advise.

## 2019-06-19 ENCOUNTER — Telehealth: Payer: Self-pay | Admitting: Family Medicine

## 2019-06-19 NOTE — Telephone Encounter (Signed)
This appears that it was not routed back to Dr Raoul Pitch, Patient is calling back requesting assistance with recalled Nature-Throid.  Please advise.

## 2019-06-19 NOTE — Telephone Encounter (Signed)
Duplicate message.  Please see phone note dated 06/16/2019.

## 2019-06-19 NOTE — Telephone Encounter (Signed)
Following up regarding thyroid meds. Called last week and has not heard any thing yet.  Please contact patient [phone].  Thank you.

## 2019-06-20 ENCOUNTER — Other Ambulatory Visit: Payer: Self-pay

## 2019-06-21 NOTE — Telephone Encounter (Signed)
Called patient and stated that you were out of town and that is why she hasnt gotten a call back yet. I told her you are returning this week and I will give her a call back as soon as I have some information for her. She stated that she is been out of medication for a week

## 2019-06-22 ENCOUNTER — Encounter: Payer: Self-pay | Admitting: Obstetrics and Gynecology

## 2019-06-22 ENCOUNTER — Ambulatory Visit (INDEPENDENT_AMBULATORY_CARE_PROVIDER_SITE_OTHER): Payer: 59 | Admitting: Obstetrics and Gynecology

## 2019-06-22 ENCOUNTER — Other Ambulatory Visit: Payer: Self-pay

## 2019-06-22 VITALS — BP 122/80 | HR 64 | Temp 97.0°F | Ht 63.5 in | Wt 212.2 lb

## 2019-06-22 DIAGNOSIS — G8929 Other chronic pain: Secondary | ICD-10-CM

## 2019-06-22 DIAGNOSIS — M25512 Pain in left shoulder: Secondary | ICD-10-CM

## 2019-06-22 DIAGNOSIS — N62 Hypertrophy of breast: Secondary | ICD-10-CM | POA: Diagnosis not present

## 2019-06-22 DIAGNOSIS — B372 Candidiasis of skin and nail: Secondary | ICD-10-CM

## 2019-06-22 DIAGNOSIS — M25511 Pain in right shoulder: Secondary | ICD-10-CM

## 2019-06-22 DIAGNOSIS — Z01419 Encounter for gynecological examination (general) (routine) without abnormal findings: Secondary | ICD-10-CM

## 2019-06-22 DIAGNOSIS — M549 Dorsalgia, unspecified: Secondary | ICD-10-CM

## 2019-06-22 MED ORDER — NYSTATIN 100000 UNIT/GM EX CREA: 1.0000 "application " | TOPICAL_CREAM | Freq: Two times a day (BID) | CUTANEOUS | 0 refills | Status: DC

## 2019-06-22 MED ORDER — LEVOTHYROXINE SODIUM 50 MCG PO TABS: 50.0000 ug | ORAL_TABLET | Freq: Every day | ORAL | 3 refills | Status: DC

## 2019-06-22 NOTE — Progress Notes (Signed)
58 y.o. G2P2 Married White or Caucasian Not Hispanic or Latino female here for annual exam.   H/O TVH, still has her ovaries. No vaginal bleeding. Sexually active, no pain.   She has bad arthritis in her ankle, she exercises in the pools, can't exercise because the pool is closed.   Her large breasts are bothering her. She has upper back pain, shoulder pain, suffocates her at time.     No LMP recorded. Patient has had a hysterectomy.          Sexually active: Yes.    The current method of family planning is status post hysterectomy.    Exercising: No.  The patient does not participate in regular exercise at present. Smoker:  no  Health Maintenance: Pap:  04-29-15 WN NEG HR HPV History of abnormal Pap:  no MMG:  04/18/2018 Birads 1 negative Colonoscopy:  08-16-12 WNL, f/u in 10 years. BMD:   Never TDaP:  06-16-12 Gardasil: N/A   reports that she quit smoking about 39 years ago. Her smoking use included cigarettes. She has never used smokeless tobacco. She reports that she does not drink alcohol or use drugs. Works as an Web designer. 2 grown kids, 2 grand kids. Grand kids and daughter are local. Son was in [place], lost his job and will be moving back here.   Past Medical History:  Diagnosis Date  . Allergy   . Chicken pox as a child  . GERD (gastroesophageal reflux disease)   . Hematuria   . Hyperlipidemia   . Hypertension   . Kidney stone   . Neck problem 06/16/2012    Past Surgical History:  Procedure Laterality Date  . [place]  . laparoscopically assisted vaginal hysterectomy  1993   partial- still has ovaries  . TUBAL LIGATION    . WISDOM TOOTH EXTRACTION  58 yrs old    Current Outpatient Medications  Medication Sig Dispense Refill  . atorvastatin (LIPITOR) 20 MG tablet Take 1 tablet (20 mg total) by mouth daily. 90 tablet 1  . levothyroxine (SYNTHROID) 50 MCG tablet Take 1 tablet (50 mcg total) by mouth daily. 90 tablet 3  .  lisinopril-hydrochlorothiazide (ZESTORETIC) 10-12.5 MG tablet Take 1 tablet by mouth daily. 90 tablet 1   No current facility-administered medications for this visit.     Family History  Problem Relation Age of Onset  . Dementia Father 31  . Hyperlipidemia Father   . Hypertension Father   . Throat cancer Brother 50        smoke- remission?  . Alcohol abuse Brother   . Dementia Maternal Grandmother   . Heart attack Maternal Grandfather   . Cancer Paternal Grandfather        unsure of type  . Alcohol abuse Brother   . Alcohol abuse Brother   . Colon cancer Neg Hx   . Rectal cancer Neg Hx   . Esophageal cancer Neg Hx   . Stomach cancer Neg Hx     Review of Systems  Constitutional: Negative.   HENT: Negative.   Eyes: Negative.   Respiratory: Negative.   Cardiovascular: Negative.   Gastrointestinal: Negative.   Endocrine: Negative.   Genitourinary: Negative.   Musculoskeletal: Negative.   Skin: Negative.   Allergic/Immunologic: Negative.   Neurological: Negative.   Hematological: Negative.   Psychiatric/Behavioral: Negative.     Exam:   BP 122/80 (BP Location: Right Arm, Patient Position: Sitting, Cuff Size: Large)   Pulse 64  Temp (!) 97 F (36.1 C) (Skin)   Ht 5' 3.5" (1.613 m)   Wt 212 lb 3.2 oz (96.3 kg)   BMI 37.00 kg/m   Weight change: @WEIGHTCHANGE @ Height:   Height: 5' 3.5" (161.3 cm)  Ht Readings from Last 3 Encounters:  06/22/19 5' 3.5" (1.613 m)  05/19/19 5\' 4"  (1.626 m)  12/07/18 5\' 4"  (1.626 m)    General appearance: alert, cooperative and appears stated age Head: Normocephalic, without obvious abnormality, atraumatic Neck: no adenopathy, supple, symmetrical, trachea midline and thyroid normal to inspection and palpation Lungs: clear to auscultation bilaterally Cardiovascular: regular rate and rhythm Breasts: normal appearance, no masses or tenderness, large, pendulous  Abdomen: soft, non-tender; non distended,  no masses,  no  organomegaly Extremities: extremities normal, atraumatic, no cyanosis or edema Skin: Skin color, texture, turgor normal. Mild erythematous, patchy rash on her chest wall between her breasts.  Lymph nodes: Cervical, supraclavicular, and axillary nodes normal. No abnormal inguinal nodes palpated Neurologic: Grossly normal   Pelvic: External genitalia:  no lesions              Urethra:  normal appearing urethra with no masses, tenderness or lesions              Bartholins and Skenes: normal                 Vagina: normal appearing vagina with normal color and discharge, no lesions              Cervix: absent               Bimanual Exam:  Uterus:  uterus absent              Adnexa: no mass, fullness, tenderness               Rectovaginal: Confirms               Anus:  normal sphincter tone, no lesions  Chaperone was present for exam.  A:  Well Woman with normal exam  H/o hysterectomy  Hypothyroid, HTN, elevated labs. Managed by primary  Rash between her breasts, suspect candida intertrigo  Large breasts, has breast and shoulder pain  P:   No pap needed  Labs with primary  Mammogram due  Colonoscopy UTD  Discussed breast self exam  Discussed calcium and vit D intake  Referral placed to Dr Stephanie Coup to discuss breast reduction

## 2019-06-22 NOTE — Patient Instructions (Signed)
Dr Stephanie Coup  EXERCISE AND DIET:  We recommended that you start or continue a regular exercise program for good health. Regular exercise means any activity that makes your heart beat faster and makes you sweat.  We recommend exercising at least 30 minutes per day at least 3 days a week, preferably 4 or 5.  We also recommend a diet low in fat and sugar.  Inactivity, poor dietary choices and obesity can cause diabetes, heart attack, stroke, and kidney damage, among others.    ALCOHOL AND SMOKING:  Women should limit their alcohol intake to no more than 7 drinks/beers/glasses of wine (combined, not each!) per week. Moderation of alcohol intake to this level decreases your risk of breast cancer and liver damage. And of course, no recreational drugs are part of a healthy lifestyle.  And absolutely no smoking or even second hand smoke. Most people know smoking can cause heart and lung diseases, but did you know it also contributes to weakening of your bones? Aging of your skin?  Yellowing of your teeth and nails?  CALCIUM AND VITAMIN D:  Adequate intake of calcium and Vitamin D are recommended.  The recommendations for exact amounts of these supplements seem to change often, but generally speaking 1,200 mg of calcium (between diet and supplement) and 800 units of Vitamin D per day seems prudent. Certain women may benefit from higher intake of Vitamin D.  If you are among these women, your doctor will have told you during your visit.    PAP SMEARS:  Pap smears, to check for cervical cancer or precancers,  have traditionally been done yearly, although recent scientific advances have shown that most women can have pap smears less often.  However, every woman still should have a physical exam from her gynecologist every year. It will include a breast check, inspection of the vulva and vagina to check for abnormal growths or skin changes, a visual exam of the cervix, and then an exam to evaluate the size and shape of the  uterus and ovaries.  And after 58 years of age, a rectal exam is indicated to check for rectal cancers. We will also provide age appropriate advice regarding health maintenance, like when you should have certain vaccines, screening for sexually transmitted diseases, bone density testing, colonoscopy, mammograms, etc.   MAMMOGRAMS:  All women over 47 years old should have a yearly mammogram. Many facilities now offer a "3D" mammogram, which may cost around $50 extra out of pocket. If possible,  we recommend you accept the option to have the 3D mammogram performed.  It both reduces the number of women who will be called back for extra views which then turn out to be normal, and it is better than the routine mammogram at detecting truly abnormal areas.    COLON CANCER SCREENING: Now recommend starting at age 40. At this time colonoscopy is not covered for routine screening until 50. There are take home tests that can be done between 45-49.   COLONOSCOPY:  Colonoscopy to screen for colon cancer is recommended for all women at age 74.  We know, you hate the idea of the prep.  We agree, BUT, having colon cancer and not knowing it is worse!!  Colon cancer so often starts as a polyp that can be seen and removed at colonscopy, which can quite literally save your life!  And if your first colonoscopy is normal and you have no family history of colon cancer, most women don't have to have  it again for 10 years.  Once every ten years, you can do something that may end up saving your life, right?  We will be happy to help you get it scheduled when you are ready.  Be sure to check your insurance coverage so you understand how much it will cost.  It may be covered as a preventative service at no cost, but you should check your particular policy.      Breast Self-Awareness Breast self-awareness means being familiar with how your breasts look and feel. It involves checking your breasts regularly and reporting any changes to  your health care provider. Practicing breast self-awareness is important. A change in your breasts can be a sign of a serious medical problem. Being familiar with how your breasts look and feel allows you to find any problems early, when treatment is more likely to be successful. All women should practice breast self-awareness, including women who have had breast implants. How to do a breast self-exam One way to learn what is normal for your breasts and whether your breasts are changing is to do a breast self-exam. To do a breast self-exam: Look for Changes  1. Remove all the clothing above your waist. 2. Stand in front of a mirror in a room with good lighting. 3. Put your hands on your hips. 4. Push your hands firmly downward. 5. Compare your breasts in the mirror. Look for differences between them (asymmetry), such as: ? Differences in shape. ? Differences in size. ? Puckers, dips, and bumps in one breast and not the other. 6. Look at each breast for changes in your skin, such as: ? Redness. ? Scaly areas. 7. Look for changes in your nipples, such as: ? Discharge. ? Bleeding. ? Dimpling. ? Redness. ? A change in position. Feel for Changes Carefully feel your breasts for lumps and changes. It is best to do this while lying on your back on the floor and again while sitting or standing in the shower or tub with soapy water on your skin. Feel each breast in the following way:  Place the arm on the side of the breast you are examining above your head.  Feel your breast with the other hand.  Start in the nipple area and make  inch (2 cm) overlapping circles to feel your breast. Use the pads of your three middle fingers to do this. Apply light pressure, then medium pressure, then firm pressure. The light pressure will allow you to feel the tissue closest to the skin. The medium pressure will allow you to feel the tissue that is a little deeper. The firm pressure will allow you to feel the  tissue close to the ribs.  Continue the overlapping circles, moving downward over the breast until you feel your ribs below your breast.  Move one finger-width toward the center of the body. Continue to use the  inch (2 cm) overlapping circles to feel your breast as you move slowly up toward your collarbone.  Continue the up and down exam using all three pressures until you reach your armpit.  Write Down What You Find  Write down what is normal for each breast and any changes that you find. Keep a written record with breast changes or normal findings for each breast. By writing this information down, you do not need to depend only on memory for size, tenderness, or location. Write down where you are in your menstrual cycle, if you are still menstruating. If you are having  trouble noticing differences in your breasts, do not get discouraged. With time you will become more familiar with the variations in your breasts and more comfortable with the exam. How often should I examine my breasts? Examine your breasts every month. If you are breastfeeding, the best time to examine your breasts is after a feeding or after using a breast pump. If you menstruate, the best time to examine your breasts is 5-7 days after your period is over. During your period, your breasts are lumpier, and it may be more difficult to notice changes. When should I see my health care provider? See your health care provider if you notice:  A change in shape or size of your breasts or nipples.  A change in the skin of your breast or nipples, such as a reddened or scaly area.  Unusual discharge from your nipples.  A lump or thick area that was not there before.  Pain in your breasts.  Anything that concerns you.

## 2019-06-22 NOTE — Telephone Encounter (Signed)
Please inform patient the following information: I have called in Levothyroxine 50 mcg QD. This is the equivalent of her Petra Kuba thyroid 32.5.

## 2019-06-22 NOTE — Telephone Encounter (Signed)
Patient advised.

## 2019-06-22 NOTE — Addendum Note (Signed)
Addended by: Howard Pouch A on: 06/22/2019 07:48 AM   Modules accepted: Orders

## 2019-07-20 IMAGING — MG DIGITAL DIAGNOSTIC UNILATERAL RIGHT MAMMOGRAM WITH TOMO AND CAD
4 series · 4 of 12 positions shown · non-contrast
Comparison: Previous exam(s).

CLINICAL DATA: Patient recalled from screening for asymmetry and
possible distortion right breast.

EXAM:
DIGITAL DIAGNOSTIC RIGHT MAMMOGRAM WITH CAD AND TOMO
ULTRASOUND RIGHT BREAST

[R MLO synth-2D]
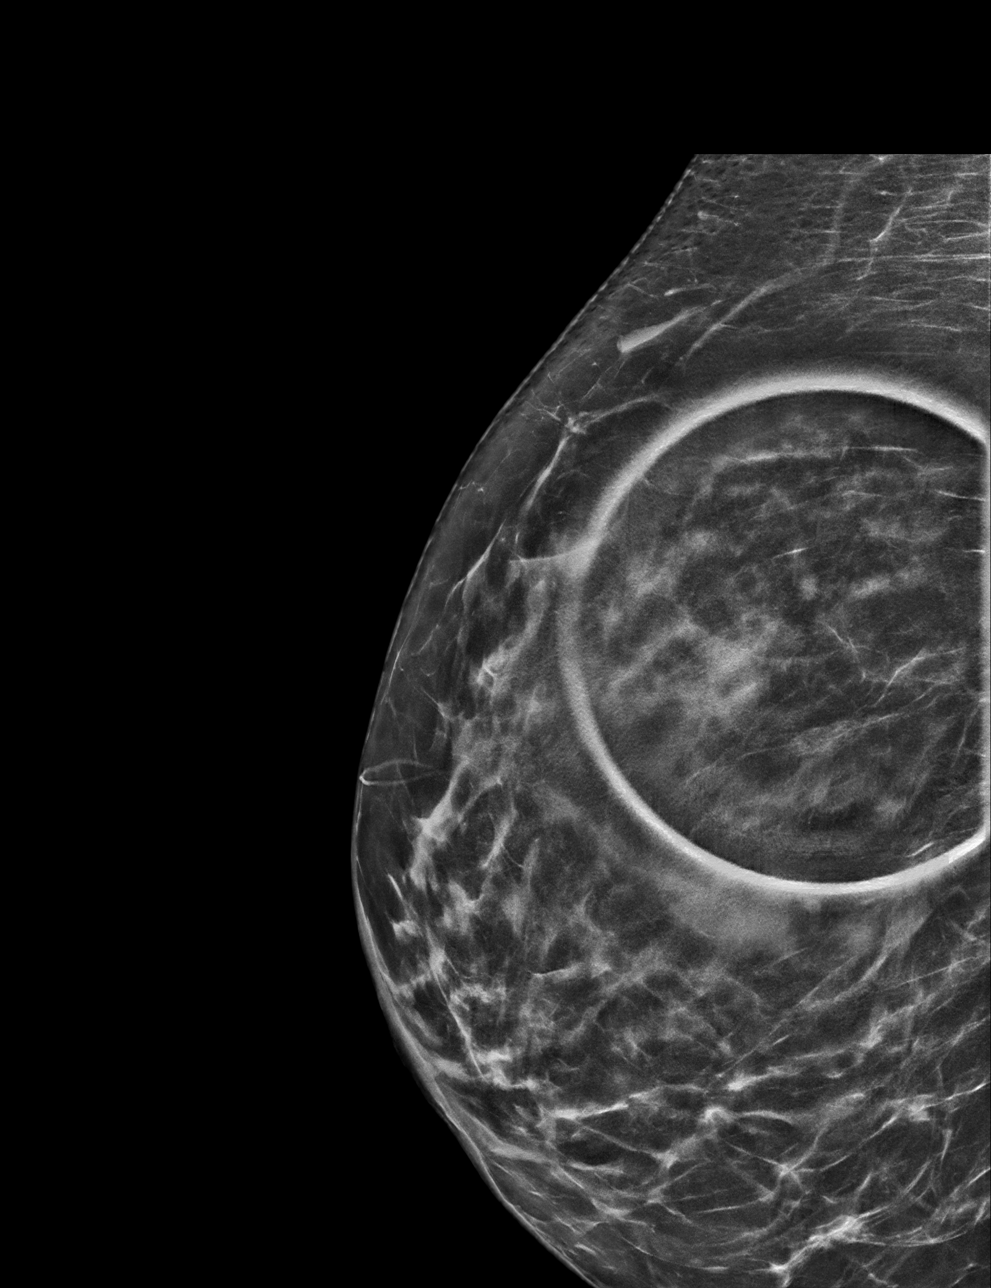

[R CC synth-2D]
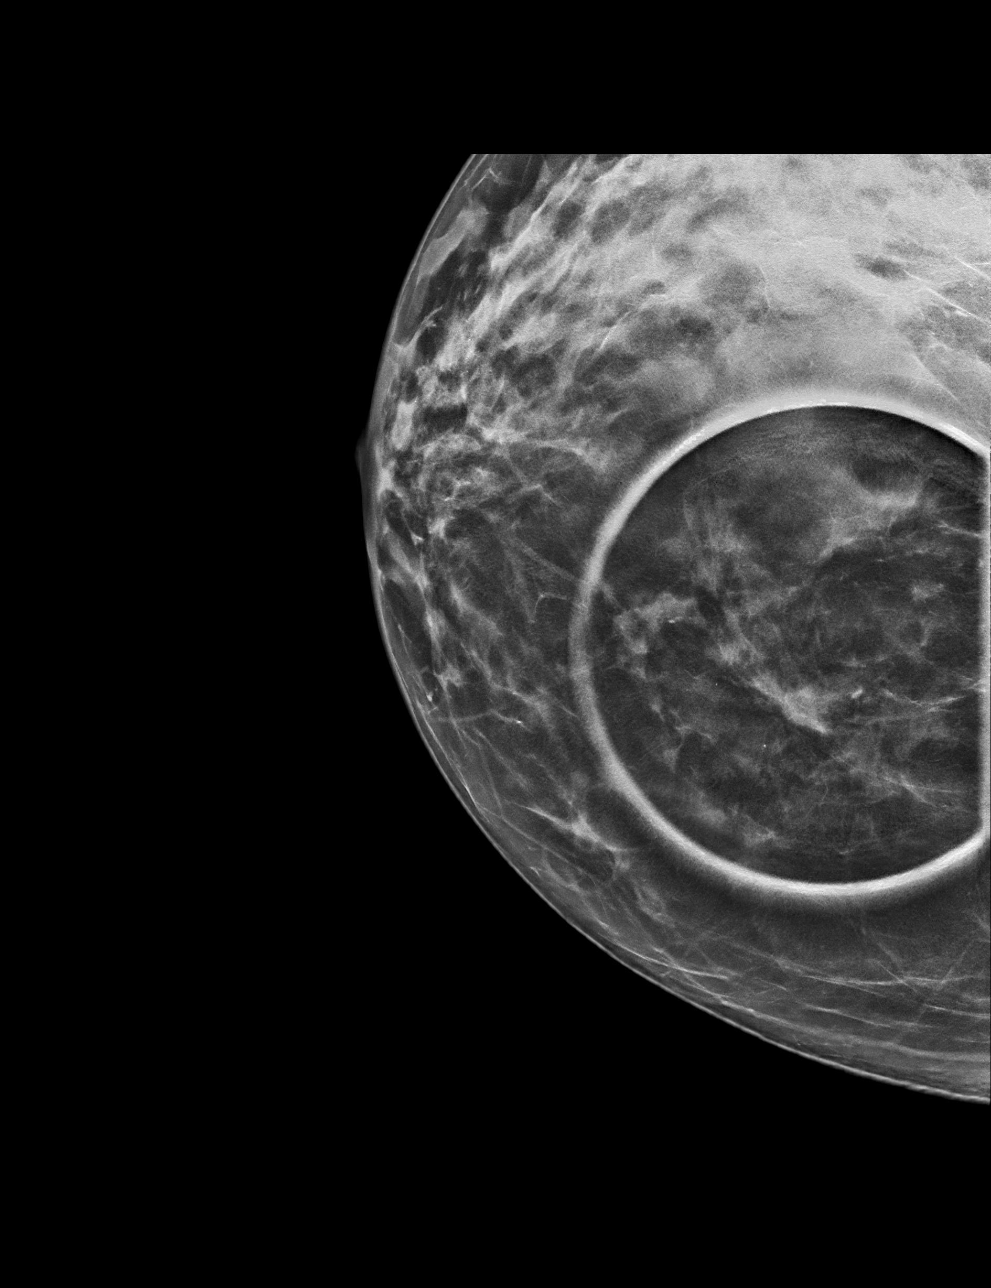

[R MLO tomo · tomo slice 41/80.0]
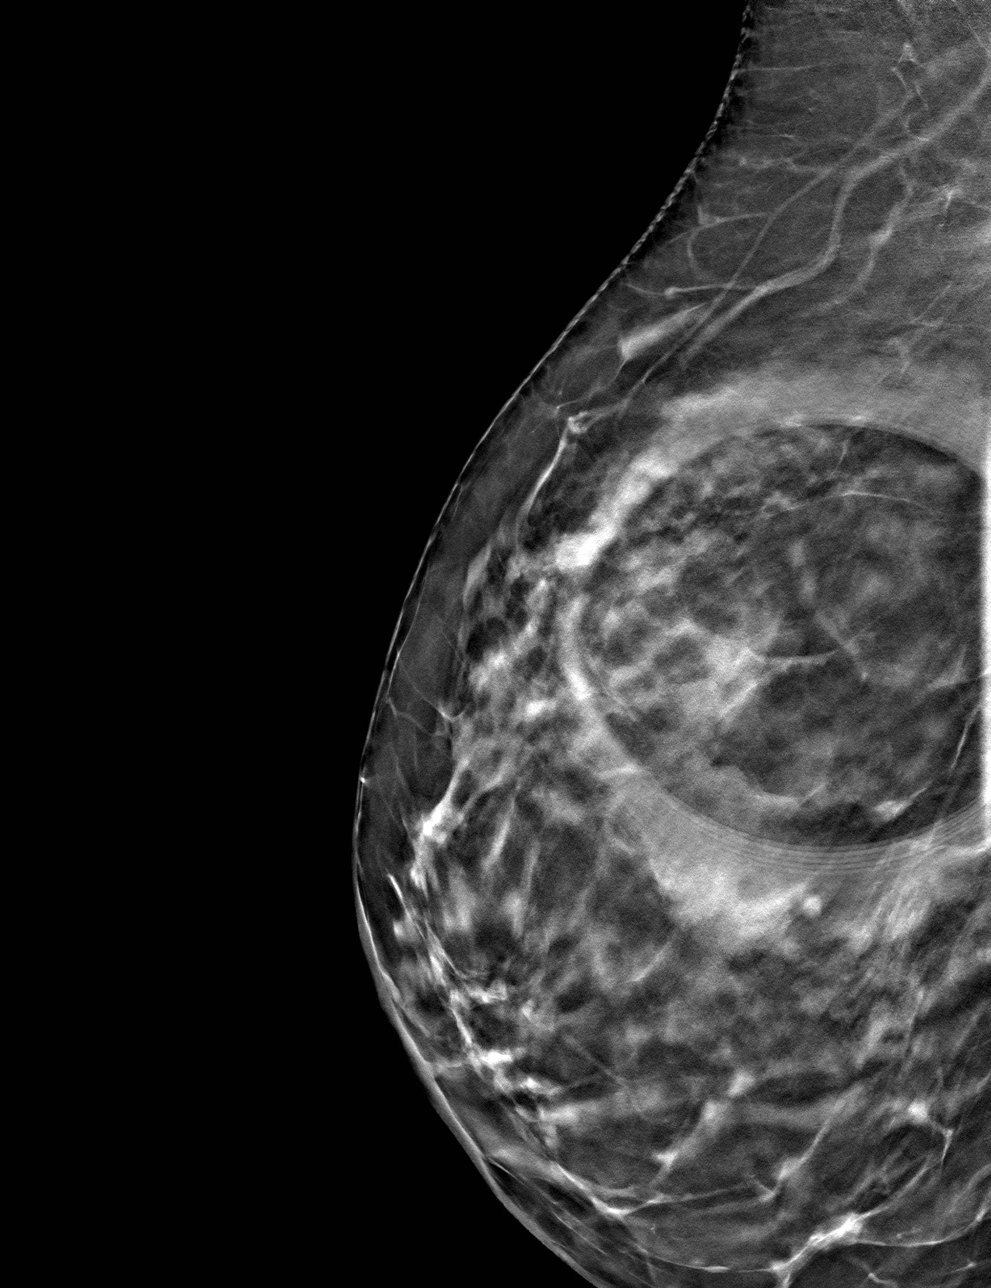

[R CC tomo · tomo slice 31/62.0]
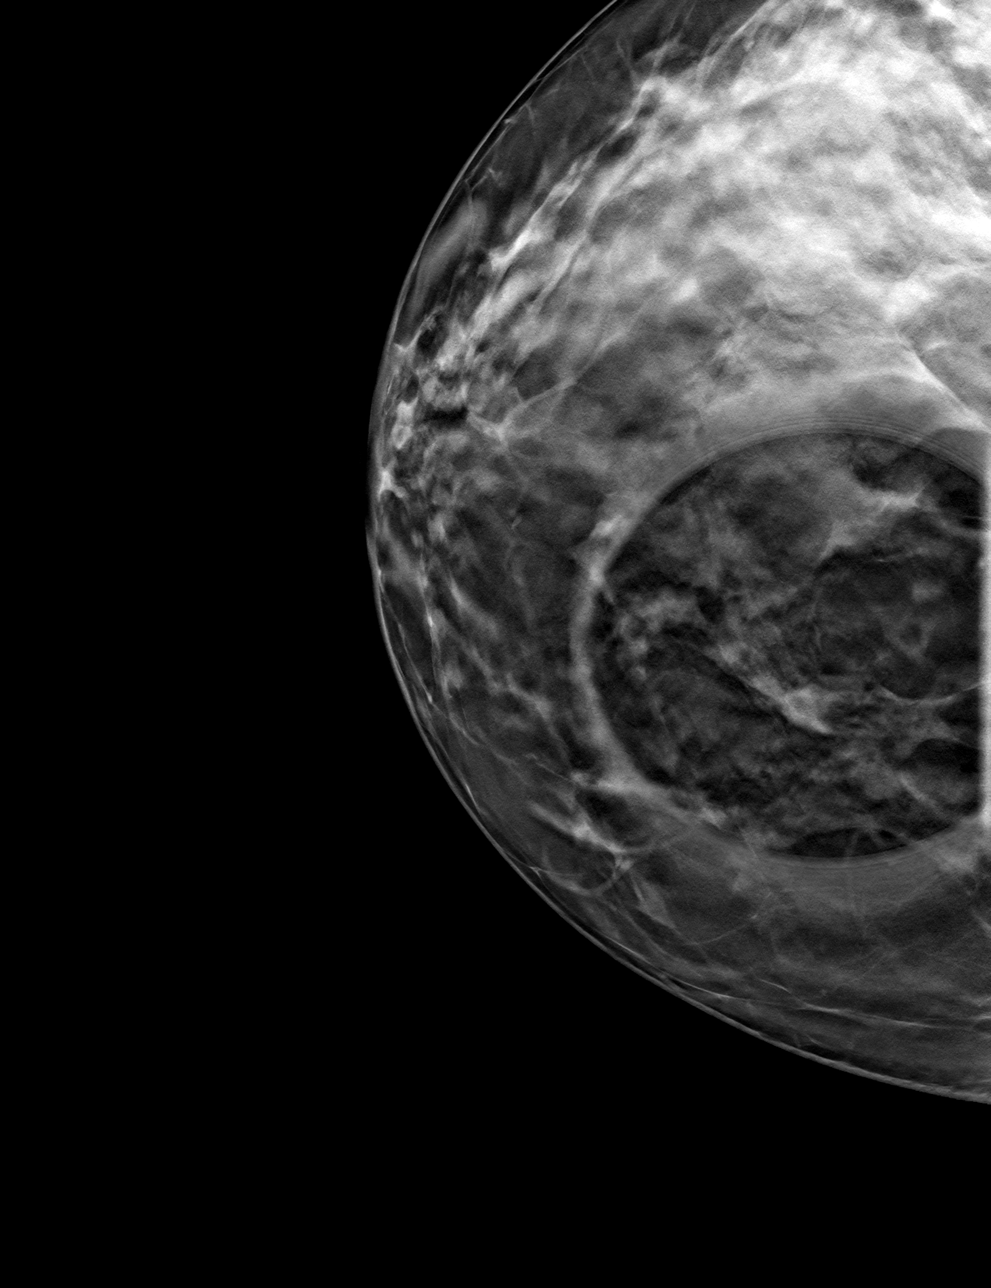

[4 of 12 positions shown; findings below may reference images not displayed]

ACR Breast Density Category c: The breast tissue is heterogeneously
dense, which may obscure small masses.
FINDINGS: Spot compression CC and MLO views of the right breast were obtained.
Questioned distortion and asymmetry appeared to resolve with
additional imaging.

Mammographic images were processed with CAD.

On physical exam, I palpate no discrete mass within the medial right
breast.

Targeted ultrasound is performed, showing normal dense tissue
without suspicious mass within the upper inner right breast.
IMPRESSION: No mammographic evidence for malignancy.

RECOMMENDATION:
Screening mammogram in one year.(Code:Q3-1-Q4L)

I have discussed the findings and recommendations with the patient.
Results were also provided in writing at the conclusion of the
visit. If applicable, a reminder letter will be sent to the patient
regarding the next appointment.

BI-RADS CATEGORY  1: Negative.

## 2019-08-15 ENCOUNTER — Telehealth: Payer: Self-pay | Admitting: Obstetrics and Gynecology

## 2019-08-15 NOTE — Telephone Encounter (Signed)
Call placed to follow up on referral. UNABLE to leave a message voicemail is not setup.

## 2019-11-14 ENCOUNTER — Other Ambulatory Visit: Payer: Self-pay

## 2019-11-14 MED ORDER — ATORVASTATIN CALCIUM 20 MG PO TABS: 20.0000 mg | ORAL_TABLET | Freq: Every day | ORAL | 1 refills | Status: DC

## 2019-11-23 ENCOUNTER — Other Ambulatory Visit: Payer: Self-pay

## 2019-11-23 MED ORDER — LISINOPRIL-HYDROCHLOROTHIAZIDE 10-12.5 MG PO TABS: 1.0000 | ORAL_TABLET | Freq: Every day | ORAL | 0 refills | Status: DC

## 2019-11-30 ENCOUNTER — Encounter: Payer: Self-pay | Admitting: Family Medicine

## 2019-11-30 ENCOUNTER — Other Ambulatory Visit: Payer: Self-pay

## 2019-11-30 ENCOUNTER — Ambulatory Visit (INDEPENDENT_AMBULATORY_CARE_PROVIDER_SITE_OTHER): Payer: 59 | Admitting: Family Medicine

## 2019-11-30 VITALS — BP 124/82 | HR 68 | Temp 97.8°F | Resp 16 | Wt 211.0 lb

## 2019-11-30 DIAGNOSIS — Z79899 Other long term (current) drug therapy: Secondary | ICD-10-CM

## 2019-11-30 DIAGNOSIS — E782 Mixed hyperlipidemia: Secondary | ICD-10-CM

## 2019-11-30 DIAGNOSIS — I10 Essential (primary) hypertension: Secondary | ICD-10-CM | POA: Diagnosis not present

## 2019-11-30 DIAGNOSIS — E039 Hypothyroidism, unspecified: Secondary | ICD-10-CM

## 2019-11-30 LAB — COMPREHENSIVE METABOLIC PANEL
ALT: 18 U/L (ref 0–35)
AST: 17 U/L (ref 0–37)
Albumin: 4.2 g/dL (ref 3.5–5.2)
Alkaline Phosphatase: 102 U/L (ref 39–117)
BUN: 13 mg/dL (ref 6–23)
CO2: 29 mEq/L (ref 19–32)
Calcium: 9.5 mg/dL (ref 8.4–10.5)
Chloride: 102 mEq/L (ref 96–112)
Creatinine, Ser: 0.73 mg/dL (ref 0.40–1.20)
GFR: 81.78 mL/min (ref >60.00)
Glucose, Bld: 92 mg/dL (ref 70–99)
Potassium: 4.4 mEq/L (ref 3.5–5.1)
Sodium: 138 mEq/L (ref 135–145)
Total Bilirubin: 0.6 mg/dL (ref 0.2–1.2)
Total Protein: 6.7 g/dL (ref 6.0–8.3)

## 2019-11-30 LAB — CBC
HCT: 38.7 % (ref 36.0–46.0)
Hemoglobin: 13.1 g/dL (ref 12.0–15.0)
MCHC: 33.9 g/dL (ref 30.0–36.0)
MCV: 91.3 fl (ref 78.0–100.0)
Platelets: 241 10*3/uL (ref 150.0–400.0)
RBC: 4.24 Mil/uL (ref 3.87–5.11)
RDW: 12.7 % (ref 11.5–15.5)
WBC: 7.2 10*3/uL (ref 4.0–10.5)

## 2019-11-30 LAB — LIPID PANEL
Cholesterol: 146 mg/dL (ref 0–200)
HDL: 47.3 mg/dL (ref >39.00)
LDL Cholesterol: 82 mg/dL (ref 0–99)
NonHDL: 98.31
Total CHOL/HDL Ratio: 3
Triglycerides: 83 mg/dL (ref 0.0–149.0)
VLDL: 16.6 mg/dL (ref 0.0–40.0)

## 2019-11-30 LAB — T4, FREE: Free T4: 1.19 ng/dL (ref 0.60–1.60)

## 2019-11-30 LAB — TSH: TSH: 0.99 u[IU]/mL (ref 0.35–4.50)

## 2019-11-30 MED ORDER — LISINOPRIL-HYDROCHLOROTHIAZIDE 10-12.5 MG PO TABS: 1.0000 | ORAL_TABLET | Freq: Every day | ORAL | 1 refills | Status: DC

## 2019-11-30 MED ORDER — ATORVASTATIN CALCIUM 20 MG PO TABS: 20.0000 mg | ORAL_TABLET | Freq: Every day | ORAL | 1 refills | Status: DC

## 2019-11-30 NOTE — Patient Instructions (Signed)
COVID-19 Vaccine Information can be found at: [email or web address] For questions related to vaccine distribution or appointments, please email [email or web address] or call [phone].  Covid Vaccine appointment go to [email or web address].   Doristine Devoid to see you today! BP looks great. I have refilled your medications. We will call you with lab results.  Follow up end of August.

## 2019-11-30 NOTE — Progress Notes (Signed)
Sarah Suarez , 1960-11-01, 59 y.o., female MRN: [id number] Patient Care Team    Relationship Specialty Notifications Start End  Ma Hillock, DO PCP - General Family Medicine  12/29/17   Terrance Mass, MD (Inactive)  Gynecology  12/29/17   Ladene Artist, MD Consulting Physician Gastroenterology  12/29/17   Kerney Elbe, MD Consulting Physician Alternative Medicine  12/29/17   Marica Otter, OD  Optometry  12/29/17     Chief Complaint  Patient presents with  . Hypertension     Subjective:  Sarah Suarez is a 59 y.o. female present for [facility] follow up hypertension/HLD/obesity: She reports compliance with lisinopril-hctz 10/12.5 and lipitor 20 mg qd. Blood pressures ranges at home are not checked. Patient denies chest pain, shortness of breath, dizziness or lower extremity edema.  She exercise everyday and watches her diet. RF:  Fhx HD, HLD, obesity CBC, CMP, TSH completed 11/2018.  Hypothyroid:  Compliant with levothyroxine 50 mcg QD had been on  nature-throid 32.5- but had to switch 2/2 to factory recall. Pt reports she did not notice in change in the way she felt between the two medications.  TSH normal 11/2018.   Depression screen [facility] 2/9 05/19/2019 12/29/2017  Decreased Interest 0 0  Down, Depressed, Hopeless 0 0  PHQ - 2 Score 0 0    No Known Allergies Social History   Tobacco Use  . Smoking status: Former Smoker    Types: Cigarettes    Quit date: 09/22/1979    Years since quitting: 40.2  . Smokeless tobacco: Never Used  . Tobacco comment: a little in high school  Substance Use Topics  . Alcohol use: No    Alcohol/week: 0.0 standard drinks   Past Medical History:  Diagnosis Date  . Allergy   . Chicken pox as a child  . GERD (gastroesophageal reflux disease)   . Hematuria   . Hyperlipidemia   . Hypertension   . Kidney stone   . Neck problem 06/16/2012   Past Surgical History:  Procedure Laterality Date  . [place]  .  laparoscopically assisted vaginal hysterectomy  1993   partial- still has ovaries  . TUBAL LIGATION    . WISDOM TOOTH EXTRACTION  59 yrs old   Family History  Problem Relation Age of Onset  . Dementia Father 28  . Hyperlipidemia Father   . Hypertension Father   . Throat cancer Brother 50        smoke- remission?  . Alcohol abuse Brother   . Dementia Maternal Grandmother   . Heart attack Maternal Grandfather   . Cancer Paternal Grandfather        unsure of type  . Alcohol abuse Brother   . Alcohol abuse Brother   . Colon cancer Neg Hx   . Rectal cancer Neg Hx   . Esophageal cancer Neg Hx   . Stomach cancer Neg Hx    Allergies as of 11/30/2019   No Known Allergies     Medication List       Accurate as of November 30, 2019  8:29 AM. If you have any questions, ask your nurse or doctor.        atorvastatin 20 MG tablet Commonly known as: LIPITOR Take 1 tablet (20 mg total) by mouth daily.   levothyroxine 50 MCG tablet Commonly known as: SYNTHROID Take 1 tablet (50 mcg total) by mouth daily.   lisinopril-hydrochlorothiazide 10-12.5 MG tablet Commonly known as:  ZESTORETIC Take 1 tablet by mouth daily.   nystatin cream Commonly known as: MYCOSTATIN Apply 1 application topically 2 (two) times daily. Apply to affected area BID for up to 7 days.       All past medical history, surgical history, allergies, family history, immunizations andmedications were updated in the EMR today and reviewed under the history and medication portions of their EMR.     ROS: Negative, with the exception of above mentioned in HPI   Objective:  BP 124/82 (BP Location: Left Arm, Patient Position: Sitting, Cuff Size: Large)   Pulse 68   Temp 97.8 F (36.6 C) (Temporal)   Resp 16   Wt 211 lb (95.7 kg)   SpO2 99%   BMI 36.79 kg/m  Body mass index is 36.79 kg/m. Gen: Afebrile. No acute distress. Nontoxic. Pleasant caucasian female.  HENT: AT. [place].  Eyes:Pupils Equal Round Reactive to  light, Extraocular movements intact,  Conjunctiva without redness, discharge or icterus. Neck/lymp/endocrine: Supple,no lymphadenopathy, no thyromegaly CV: RRR no murmur, no edema, +2/4 P posterior tibialis pulses Chest: CTAB, no wheeze or crackles Neuro:  Normal gait. PERLA. EOMi. Alert. Oriented x 3  Psych: Normal affect, dress and demeanor. Normal speech. Normal thought content and judgment.    No exam data present No results found. No results found for this or any previous visit (from the past 24 hour(s)).  Assessment/Plan: Sarah Suarez is a 59 y.o. female present for OV for   HTN/HLD/obesity:  - stable.   -Start lisinopril-HCTZ 10-12.5 mg daily - continue statin - low sodium diet, continue to exercise.  -Lipid, tsh, CBC and CMP collected today  Hypothyroid:  TSH NL 12/08/2018 normal.  Refills will be provided after lab result.      Reviewed expectations re: course of current medical issues.  Discussed self-management of symptoms.  Outlined signs and symptoms indicating need for more acute intervention.  Patient verbalized understanding and all questions were answered.  Patient received an After-Visit Summary.   Orders Placed This Encounter  Procedures  . TSH  . T4, free  . CBC  . Comp Met (CMET)  . Lipid panel   Meds ordered this encounter  Medications  . lisinopril-hydrochlorothiazide (ZESTORETIC) 10-12.5 MG tablet    Sig: Take 1 tablet by mouth daily.    Dispense:  90 tablet    Refill:  1  . atorvastatin (LIPITOR) 20 MG tablet    Sig: Take 1 tablet (20 mg total) by mouth daily.    Dispense:  90 tablet    Refill:  1   Referral Orders  No referral(s) requested today      Note is dictated utilizing voice recognition software. Although note has been proof read prior to signing, occasional typographical errors still can be missed. If any questions arise, please do not hesitate to call for verification.   electronically signed by:  Howard Pouch, DO   [place]

## 2019-12-01 ENCOUNTER — Telehealth: Payer: Self-pay | Admitting: Family Medicine

## 2019-12-01 MED ORDER — LEVOTHYROXINE SODIUM 50 MCG PO TABS: 50.0000 ug | ORAL_TABLET | Freq: Every day | ORAL | 3 refills | Status: DC

## 2019-12-01 NOTE — Telephone Encounter (Signed)
Patient advised of lab results

## 2019-12-01 NOTE — Telephone Encounter (Signed)
Please inform patient the following information: Her liver, kidney and thyroid fx are all normal. I have refilled her thyroid medication at her current dose.  Her cholesterol panel looks great!  total> 258 prior, now 146 LDL> 168 prior, now 82 Tg> 193 prior, now 83

## 2020-01-22 ENCOUNTER — Other Ambulatory Visit (HOSPITAL_BASED_OUTPATIENT_CLINIC_OR_DEPARTMENT_OTHER): Payer: Self-pay | Admitting: Obstetrics and Gynecology

## 2020-01-22 DIAGNOSIS — Z1239 Encounter for other screening for malignant neoplasm of breast: Secondary | ICD-10-CM

## 2020-01-23 ENCOUNTER — Other Ambulatory Visit: Payer: Self-pay

## 2020-01-23 ENCOUNTER — Encounter (HOSPITAL_BASED_OUTPATIENT_CLINIC_OR_DEPARTMENT_OTHER): Payer: Self-pay

## 2020-01-23 ENCOUNTER — Ambulatory Visit (HOSPITAL_BASED_OUTPATIENT_CLINIC_OR_DEPARTMENT_OTHER)
Admission: RE | Admit: 2020-01-23 | Discharge: 2020-01-23 | Disposition: A | Discharge status: Home / Self Care | Payer: 59 | Source: Ambulatory Visit | Attending: Obstetrics and Gynecology | Admitting: Obstetrics and Gynecology

## 2020-01-23 DIAGNOSIS — Z1239 Encounter for other screening for malignant neoplasm of breast: Secondary | ICD-10-CM

## 2020-01-23 DIAGNOSIS — Z1231 Encounter for screening mammogram for malignant neoplasm of breast: Secondary | ICD-10-CM | POA: Diagnosis present

## 2020-06-25 ENCOUNTER — Encounter: Payer: Self-pay | Admitting: Family Medicine

## 2020-06-25 ENCOUNTER — Other Ambulatory Visit: Payer: Self-pay

## 2020-06-25 ENCOUNTER — Ambulatory Visit (HOSPITAL_BASED_OUTPATIENT_CLINIC_OR_DEPARTMENT_OTHER)
Admission: RE | Admit: 2020-06-25 | Discharge: 2020-06-25 | Disposition: A | Discharge status: Home / Self Care | Payer: 59 | Source: Ambulatory Visit | Attending: Family Medicine | Admitting: Family Medicine

## 2020-06-25 ENCOUNTER — Ambulatory Visit (INDEPENDENT_AMBULATORY_CARE_PROVIDER_SITE_OTHER): Payer: 59 | Admitting: Family Medicine

## 2020-06-25 VITALS — BP 123/78 | HR 70 | Temp 97.7°F | Ht 63.5 in | Wt 199.0 lb

## 2020-06-25 DIAGNOSIS — R1319 Other dysphagia: Secondary | ICD-10-CM

## 2020-06-25 DIAGNOSIS — E039 Hypothyroidism, unspecified: Secondary | ICD-10-CM | POA: Diagnosis not present

## 2020-06-25 DIAGNOSIS — R0789 Other chest pain: Secondary | ICD-10-CM | POA: Diagnosis present

## 2020-06-25 DIAGNOSIS — E782 Mixed hyperlipidemia: Secondary | ICD-10-CM | POA: Diagnosis not present

## 2020-06-25 DIAGNOSIS — I10 Essential (primary) hypertension: Secondary | ICD-10-CM

## 2020-06-25 DIAGNOSIS — K209 Esophagitis, unspecified without bleeding: Secondary | ICD-10-CM

## 2020-06-25 MED ORDER — ATORVASTATIN CALCIUM 20 MG PO TABS: 20.0000 mg | ORAL_TABLET | Freq: Every day | ORAL | 1 refills | Status: DC

## 2020-06-25 MED ORDER — LISINOPRIL-HYDROCHLOROTHIAZIDE 10-12.5 MG PO TABS
1.0000 | ORAL_TABLET | Freq: Every day | ORAL | 1 refills | Status: DC
Start: 2020-06-25 — End: 2021-01-06

## 2020-06-25 MED ORDER — OMEPRAZOLE 40 MG PO CPDR: 40.0000 mg | DELAYED_RELEASE_CAPSULE | Freq: Every day | ORAL | 3 refills | Status: DC

## 2020-06-25 MED ORDER — LEVOTHYROXINE SODIUM 50 MCG PO TABS
50.0000 ug | ORAL_TABLET | Freq: Every day | ORAL | 3 refills | Status: DC
Start: 2020-06-25 — End: 2021-09-04

## 2020-06-25 NOTE — Patient Instructions (Addendum)
Next appt in 5.5 months.  BP looks great.   Have chest xray completed today and start omeprazole daily in he morning.   This may be your hiatal hernia has enlarged and/or your GERD/esophagitis has returned, but we need to rule out other causes.

## 2020-06-25 NOTE — Progress Notes (Addendum)
Sarah Suarez , [birth year], 59 y.o., female MRN: [id number] Patient Care Team    Relationship Specialty Notifications Start End  Ma Hillock, DO PCP - General Family Medicine  12/29/17   Terrance Mass, MD (Inactive)  Gynecology  12/29/17   Ladene Artist, MD Consulting Physician Gastroenterology  12/29/17   Kerney Elbe, MD Consulting Physician Alternative Medicine  12/29/17   Marica Otter, OD  Optometry  12/29/17     Chief Complaint  Patient presents with  . Sore Throat    pt c/o throat discomfort x 6 mos; worsen in last 2 mos; pt states it sometimes "I have to gasp for breathe"     Subjective:  Sarah Suarez is a 59 y.o. female present for [facility] follow up hypertension/HLD/obesity: She reports compliance with lisinopril-hctz 10/12.5 and lipitor 20 mg qd. Blood pressures ranges at home are not checked. Patient denies chest pain, shortness of breath, dizziness or lower extremity edema.  She exercise everyday and watches her diet. RF:  Fhx HD, HLD, obesity CBC, CMP, TSH completed 11/2018.  Hypothyroid:   Compliant with levothyroxine 50 mcg QD. TSH normal 11/2019.  Throat discomfort:  Pt endorses 6 months of throat discomfort that has worsened over the last 2 months. Associated with intermittent "gasp to breathe" sensations. The discomfort of her throat is constant and feels like a pressure sensation behind manubrium area. The gasping sensation is quick and fleeting occurs a few times a day sometimes. She denies left sided chest pain with radiation, nausea, diaphoresis,  dyspnea, dizziness or shortness of breath. Gasping sensation can occur when laying flat or during the day- sometimes will radiate towards back, but not always. . It is not associated with meals or activity. She walks 1-2 miles a day without symptoms. She has noticed food and liquid can get caught in the same area she has sensation, but not frequently. She has a h/o of esophagitis, with mild esophageal erosion and  small hiatal hernia in 2013 EGD. She is currently not taking a PPI. She denies cough, sour taste in mouth or classic heartburn symptoms.   Depression screen [facility] 2/9 06/25/2020 05/19/2019 12/29/2017  Decreased Interest 0 0 0  Down, Depressed, Hopeless 0 0 0  PHQ - 2 Score 0 0 0    No Known Allergies Social History   Tobacco Use  . Smoking status: Former Smoker    Types: Cigarettes    Quit date: 09/22/1979    Years since quitting: 40.7  . Smokeless tobacco: Never Used  . Tobacco comment: a little in high school  Substance Use Topics  . Alcohol use: No    Alcohol/week: 0.0 standard drinks   Past Medical History:  Diagnosis Date  . Allergy   . Chicken pox as a child  . GERD (gastroesophageal reflux disease)   . Hematuria   . Hyperlipidemia   . Hypertension   . Kidney stone   . Neck problem 06/16/2012   Past Surgical History:  Procedure Laterality Date  . [place]  . laparoscopically assisted vaginal hysterectomy  1993   partial- still has ovaries  . TUBAL LIGATION    . WISDOM TOOTH EXTRACTION  59 yrs old   Family History  Problem Relation Age of Onset  . Dementia Father 19  . Hyperlipidemia Father   . Hypertension Father   . Throat cancer Brother 50        smoke- remission?  . Alcohol abuse Brother   .  Dementia Maternal Grandmother   . Heart attack Maternal Grandfather   . Cancer Paternal Grandfather        unsure of type  . Alcohol abuse Brother   . Alcohol abuse Brother   . Colon cancer Neg Hx   . Rectal cancer Neg Hx   . Esophageal cancer Neg Hx   . Stomach cancer Neg Hx    Allergies as of 06/25/2020   No Known Allergies     Medication List       Accurate as of June 25, 2020  5:44 PM. If you have any questions, ask your nurse or doctor.        STOP taking these medications   nystatin cream Commonly known as: MYCOSTATIN Stopped by: Howard Pouch, DO     TAKE these medications   atorvastatin 20 MG tablet Commonly known as:  LIPITOR Take 1 tablet (20 mg total) by mouth daily.   levothyroxine 50 MCG tablet Commonly known as: SYNTHROID Take 1 tablet (50 mcg total) by mouth daily.   lisinopril-hydrochlorothiazide 10-12.5 MG tablet Commonly known as: ZESTORETIC Take 1 tablet by mouth daily.   omeprazole 40 MG capsule Commonly known as: PRILOSEC Take 1 capsule (40 mg total) by mouth daily. Started by: Howard Pouch, DO       All past medical history, surgical history, allergies, family history, immunizations andmedications were updated in the EMR today and reviewed under the history and medication portions of their EMR.     ROS: Negative, with the exception of above mentioned in HPI   Objective:  BP 123/78   Pulse 70   Temp 97.7 F (36.5 C) (Oral)   Ht 5' 3.5" (1.613 m)   Wt 199 lb (90.3 kg)   SpO2 100%   BMI 34.70 kg/m  Body mass index is 34.7 kg/m. Gen: Afebrile. No acute distress. Nontoxic pleasant female.  HENT: AT. [place]. Bilateral TM visualized and normal in appearance. MMM. Eyes:Pupils Equal Round Reactive to light, Extraocular movements intact,  Conjunctiva without redness, discharge or icterus. Neck/lymp/endocrine: Supple,no lymphadenopathy, no thyromegaly CV: RRR no murmur, no edema, +2/4 P posterior tibialis pulses Chest: CTAB, no wheeze or crackles. No chest wall TTP.  Abd: Soft. obese. NTND. BS present. no Masses palpated.  Skin: no rashes, purpura or petechiae.  Neuro:  Normal gait. PERLA. EOMi. Alert. Oriented x3  Psych: Normal affect, dress and demeanor. Normal speech. Normal thought content and judgment.   No exam data present DG Chest 2 View  Result Date: 06/25/2020 CLINICAL DATA:  Midsternal chest pain for 6 months EXAM: CHEST - 2 VIEW COMPARISON:  None. FINDINGS: The heart size and mediastinal contours are within normal limits. No focal airspace consolidation, pleural effusion, or pneumothorax. The visualized skeletal structures are unremarkable. IMPRESSION: No active  cardiopulmonary disease. Electronically Signed   By: Davina Poke D.O.   On: 06/25/2020 15:39   No results found for this or any previous visit (from the past 24 hour(s)).  Assessment/Plan: Sarah Suarez is a 59 y.o. female present for OV for   HTN/HLD/obesity:  - Stable.  - Continue  lisinopril-HCTZ 10-12.5 mg daily - continue  statin - low sodium diet, continue to exercise.  - Labs UTD 11/30/2019  Hypothyroid:  TSH NL 11/2019 normal.  Continue levo 50 mcg.   Chest discomfort:  Possibly recurrent esophagitis vs symptomatic hiatal hernia. Symptoms not classic for cardiac or pulm cause given no symptoms with her exercise routine,  but will obtain CXR to be completed.  Start omeprazole 40 mg QD.  If cxr normal would refer to GI for further evaluation.  Could also consider cardiac causes if GI workup negative and symptoms remain.  Pt will be called with results and discuss further plan.     Reviewed expectations re: course of current medical issues.  Discussed self-management of symptoms.  Outlined signs and symptoms indicating need for more acute intervention.  Patient verbalized understanding and all questions were answered.  Patient received an After-Visit Summary.   Orders Placed This Encounter  Procedures  . DG Chest 2 View  . Ambulatory referral to Gastroenterology   Meds ordered this encounter  Medications  . atorvastatin (LIPITOR) 20 MG tablet    Sig: Take 1 tablet (20 mg total) by mouth daily.    Dispense:  90 tablet    Refill:  1  . lisinopril-hydrochlorothiazide (ZESTORETIC) 10-12.5 MG tablet    Sig: Take 1 tablet by mouth daily.    Dispense:  90 tablet    Refill:  1  . levothyroxine (SYNTHROID) 50 MCG tablet    Sig: Take 1 tablet (50 mcg total) by mouth daily.    Dispense:  90 tablet    Refill:  3  . omeprazole (PRILOSEC) 40 MG capsule    Sig: Take 1 capsule (40 mg total) by mouth daily.    Dispense:  30 capsule    Refill:  3    Referral  Orders     Ambulatory referral to Gastroenterology    Note is dictated utilizing voice recognition software. Although note has been proof read prior to signing, occasional typographical errors still can be missed. If any questions arise, please do not hesitate to call for verification.   electronically signed by:  Howard Pouch, DO  [place]

## 2020-06-25 NOTE — Addendum Note (Signed)
Addended by: Howard Pouch A on: 06/25/2020 05:44 PM   Modules accepted: Orders

## 2020-12-02 ENCOUNTER — Telehealth (INDEPENDENT_AMBULATORY_CARE_PROVIDER_SITE_OTHER): Payer: 59 | Admitting: Family Medicine

## 2020-12-02 ENCOUNTER — Encounter: Payer: Self-pay | Admitting: Family Medicine

## 2020-12-02 ENCOUNTER — Telehealth: Payer: Self-pay

## 2020-12-02 DIAGNOSIS — L309 Dermatitis, unspecified: Secondary | ICD-10-CM

## 2020-12-02 MED ORDER — FLUOCINOLONE ACETONIDE 0.01 % EX CREA: TOPICAL_CREAM | Freq: Two times a day (BID) | CUTANEOUS | 1 refills | Status: DC

## 2020-12-02 NOTE — Telephone Encounter (Signed)
Please advise 

## 2020-12-02 NOTE — Telephone Encounter (Signed)
Unable to LVM. VM not set up

## 2020-12-02 NOTE — Telephone Encounter (Signed)
Patient of Dr.Kuneff's requesting appt today. Patient states she has a rash on her stomach, thinks it may be shingles, but not sure. Prefers to see her primary care doctor.  Sending high priority to see if Dr. Raoul Pitch can approve an acute work in today. Please call [phone]

## 2020-12-02 NOTE — Progress Notes (Signed)
VIRTUAL VISIT VIA VIDEO  I connected with Sarah Suarez on 12/02/20 at  3:30 PM EDT by elemedicine application and verified that I am speaking with the correct person using two identifiers. Location patient: Home Location provider: [facility], Office Persons participating in the virtual visit: Patient, Dr. Raoul Pitch and Samul Dada, CMA  I discussed the limitations of evaluation and management by telemedicine and the availability of in person appointments. The patient expressed understanding and agreed to proceed.   SUBJECTIVE Chief Complaint  Patient presents with  . Rash    Pt c/o rash on trunk described with redness, itching, intermittent pain x 4 weeks; PMH of shingles     HPI: Sarah Suarez is a 60 y.o. female present for 4 week h/o rash. She has had a h/o shingles in the past  at a different location. She reports this rash is about the size of a quarter red, flat and itchy. She endorses redness, itching and intermittent pain.  She tried Hydrocortisone cream which helped some, but did not take it away. She also tried nystatin cream which was not helpful.   ROS: See pertinent positives and negatives per HPI.  Patient Active Problem List   Diagnosis Date Noted  . Abdominal pain 05/22/2019  . Acquired hypothyroidism 05/22/2019  . Morbid obesity ([place]) 12/07/2018  . Herpes zoster without complication 25/95/6387  . Hypertension 10/26/2018  . Xiphoid pain 12/29/2017  . Eustachian tube dysfunction, right 12/29/2017  . Neck problem 06/16/2012  . Hyperlipidemia   . GERD (gastroesophageal reflux disease)     Social History   Tobacco Use  . Smoking status: Former Smoker    Types: Cigarettes    Quit date: 09/22/1979    Years since quitting: 41.2  . Smokeless tobacco: Never Used  . Tobacco comment: a little in high school  Substance Use Topics  . Alcohol use: No    Alcohol/week: 0.0 standard drinks    Current Outpatient Medications:  .  atorvastatin (LIPITOR) 20 MG  tablet, Take 1 tablet (20 mg total) by mouth daily., Disp: 90 tablet, Rfl: 1 .  fluocinolone (VANOS) 0.01 % cream, Apply topically 2 (two) times daily., Disp: 30 g, Rfl: 1 .  levothyroxine (SYNTHROID) 50 MCG tablet, Take 1 tablet (50 mcg total) by mouth daily., Disp: 90 tablet, Rfl: 3 .  lisinopril-hydrochlorothiazide (ZESTORETIC) 10-12.5 MG tablet, Take 1 tablet by mouth daily., Disp: 90 tablet, Rfl: 1 .  omeprazole (PRILOSEC) 40 MG capsule, Take 1 capsule (40 mg total) by mouth daily., Disp: 30 capsule, Rfl: 3  No Known Allergies  OBJECTIVE: There were no vitals taken for this visit. Gen: No acute distress. Nontoxic in appearance.  HENT: AT. [place].  MMM.  Eyes:Pupils Equal Round Reactive to light, Extraocular movements intact,  Conjunctiva without redness, discharge or icterus. Skin: round flat red rash lower abd- about the size of a a quarter.   Neuro:  Alert. Oriented x3  Psych: Normal affect and demeanor. Normal speech. Normal thought content and judgment.  ASSESSMENT AND PLAN: Sarah Suarez is a 60 y.o. female present for  Dermatitis Rash appears more consistent with dermatitis than shingles.  vanos cream BID for at least 2 weeks. If not improving over this time would refer to derm for further eval.     Howard Pouch, DO 12/02/2020   No follow-ups on file.  No orders of the defined types were placed in this encounter.  Meds ordered this encounter  Medications  . fluocinolone (VANOS)  0.01 % cream    Sig: Apply topically 2 (two) times daily.    Dispense:  30 g    Refill:  1   Referral Orders  No referral(s) requested today

## 2020-12-02 NOTE — Telephone Encounter (Signed)
3:45 ok to work in. Can be virtual if desired.

## 2020-12-04 NOTE — Telephone Encounter (Signed)
Patient was seen on 3/14 by Dr. Raoul Pitch.  Closed encounter.

## 2021-01-06 ENCOUNTER — Other Ambulatory Visit: Payer: Self-pay | Admitting: Family Medicine

## 2021-02-02 ENCOUNTER — Other Ambulatory Visit: Payer: Self-pay | Admitting: Family Medicine

## 2021-02-03 ENCOUNTER — Other Ambulatory Visit: Payer: Self-pay | Admitting: Family Medicine

## 2021-02-07 ENCOUNTER — Other Ambulatory Visit: Payer: Self-pay | Admitting: Family Medicine

## 2021-03-01 ENCOUNTER — Other Ambulatory Visit: Payer: Self-pay | Admitting: Family Medicine

## 2021-09-04 ENCOUNTER — Other Ambulatory Visit: Payer: Self-pay

## 2021-09-04 MED ORDER — LEVOTHYROXINE SODIUM 50 MCG PO TABS: 50.0000 ug | ORAL_TABLET | Freq: Every day | ORAL | 0 refills | Status: DC

## 2021-09-16 NOTE — Progress Notes (Signed)
60 y.o. G2P2 Married White or Caucasian Not Hispanic or Latino female here for annual exam.  H/O TVH, still has her ovaries.  Sexually active, no pain. No bowel or bladder c/o.     No LMP recorded. Patient has had a hysterectomy.          Sexually active: Yes.    The current method of family planning is status post hysterectomy.   She still has her ovaries.  Exercising: Yes.     Walking and total gym  Smoker:  no  Health Maintenance: Pap:   04-29-15 WNL NEG HR HPV History of abnormal Pap:  no MMG:  01/23/20 density C Bi-rads 1 neg  BMD:   never  Colonoscopy: 08/16/12 f/u 10 years  TDaP:  06/16/12  Gardasil: n/a   reports that she quit smoking about 42 years ago. Her smoking use included cigarettes. She has never used smokeless tobacco. She reports that she does not drink alcohol and does not use drugs.Works as an Web designer. Daughter is a NP, lives locally with her 2 daughters (66 and 34). Son is an hour away, single.   Past Medical History:  Diagnosis Date   Allergy    Chicken pox as a child   GERD (gastroesophageal reflux disease)    Hematuria    Hyperlipidemia    Hypertension    Kidney stone    Neck problem 06/16/2012    Past Surgical History:  Procedure Laterality Date   CESAREAN SECTION  1986 and 1987   laparoscopically assisted vaginal hysterectomy  1993   partial- still has ovaries   TUBAL LIGATION     WISDOM TOOTH EXTRACTION  60 yrs old    Current Outpatient Medications  Medication Sig Dispense Refill   levothyroxine (SYNTHROID) 50 MCG tablet Take 1 tablet (50 mcg total) by mouth daily. 30 tablet 0   No current facility-administered medications for this visit.    Family History  Problem Relation Age of Onset   Dementia Father 70   Hyperlipidemia Father    Hypertension Father    Throat cancer Brother 55        smoke- remission?   Alcohol abuse Brother    Dementia Maternal Grandmother    Heart attack Maternal Grandfather    Cancer Paternal  Grandfather        unsure of type   Alcohol abuse Brother    Alcohol abuse Brother    Colon cancer Neg Hx    Rectal cancer Neg Hx    Esophageal cancer Neg Hx    Stomach cancer Neg Hx     Review of Systems  All other systems reviewed and are negative.  Exam:   BP 138/84    Pulse 78    Ht 5\' 4"  (1.626 m)    Wt 200 lb (90.7 kg)    SpO2 99%    BMI 34.33 kg/m   Weight change: @WEIGHTCHANGE @ Height:   Height: 5\' 4"  (162.6 cm)  Ht Readings from Last 3 Encounters:  09/18/21 5\' 4"  (1.626 m)  06/25/20 5' 3.5" (1.613 m)  06/22/19 5' 3.5" (1.613 m)    General appearance: alert, cooperative and appears stated age Head: Normocephalic, without obvious abnormality, atraumatic Neck: no adenopathy, supple, symmetrical, trachea midline and thyroid normal to inspection and palpation Lungs: clear to auscultation bilaterally Cardiovascular: regular rate and rhythm Breasts: normal appearance, no masses or tenderness Abdomen: soft, non-tender; non distended,  no masses,  no organomegaly Extremities: extremities normal, atraumatic, no cyanosis or edema  Skin: Skin color, texture, turgor normal. No rashes or lesions Lymph nodes: Cervical, supraclavicular, and axillary nodes normal. No abnormal inguinal nodes palpated Neurologic: Grossly normal   Pelvic: External genitalia:  no lesions              Urethra:  normal appearing urethra with no masses, tenderness or lesions              Bartholins and Skenes: normal                 Vagina: normal appearing vagina with normal color and discharge, no lesions              Cervix: absent               Bimanual Exam:  Uterus:  uterus absent              Adnexa: no mass, fullness, tenderness               Rectovaginal: Confirms               Anus:  normal sphincter tone, no lesions  Gae Dry chaperoned for the exam.  1. Well woman exam Discussed breast self exam Discussed calcium and vit D intake Mammogram overdue, she will schedule Colonoscopy due  in 11/23 Labs with primary

## 2021-09-18 ENCOUNTER — Encounter: Payer: Self-pay | Admitting: Obstetrics and Gynecology

## 2021-09-18 ENCOUNTER — Other Ambulatory Visit: Payer: Self-pay

## 2021-09-18 ENCOUNTER — Ambulatory Visit (INDEPENDENT_AMBULATORY_CARE_PROVIDER_SITE_OTHER): Payer: 59 | Admitting: Obstetrics and Gynecology

## 2021-09-18 VITALS — BP 138/84 | HR 78 | Ht 64.0 in | Wt 200.0 lb

## 2021-09-18 DIAGNOSIS — Z01419 Encounter for gynecological examination (general) (routine) without abnormal findings: Secondary | ICD-10-CM

## 2021-09-18 NOTE — Patient Instructions (Signed)

## 2021-10-04 ENCOUNTER — Other Ambulatory Visit: Payer: Self-pay | Admitting: Family Medicine

## 2022-02-03 ENCOUNTER — Other Ambulatory Visit (HOSPITAL_BASED_OUTPATIENT_CLINIC_OR_DEPARTMENT_OTHER): Payer: Self-pay | Admitting: Obstetrics and Gynecology

## 2022-02-03 DIAGNOSIS — Z1231 Encounter for screening mammogram for malignant neoplasm of breast: Secondary | ICD-10-CM

## 2022-02-17 ENCOUNTER — Encounter (HOSPITAL_BASED_OUTPATIENT_CLINIC_OR_DEPARTMENT_OTHER): Payer: Self-pay

## 2022-02-17 ENCOUNTER — Ambulatory Visit (HOSPITAL_BASED_OUTPATIENT_CLINIC_OR_DEPARTMENT_OTHER)
Admission: RE | Admit: 2022-02-17 | Discharge: 2022-02-17 | Disposition: A | Discharge status: Home / Self Care | Payer: 59 | Source: Ambulatory Visit | Attending: Obstetrics and Gynecology | Admitting: Obstetrics and Gynecology

## 2022-02-17 DIAGNOSIS — Z1231 Encounter for screening mammogram for malignant neoplasm of breast: Secondary | ICD-10-CM | POA: Diagnosis present

## 2022-08-12 ENCOUNTER — Encounter: Payer: Self-pay | Admitting: Gastroenterology

## 2023-06-15 ENCOUNTER — Ambulatory Visit (INDEPENDENT_AMBULATORY_CARE_PROVIDER_SITE_OTHER): Payer: 59 | Admitting: Family Medicine

## 2023-06-15 ENCOUNTER — Encounter: Payer: Self-pay | Admitting: Family Medicine

## 2023-06-15 VITALS — BP 138/88 | HR 67 | Temp 97.7°F | Ht 64.5 in | Wt 201.8 lb

## 2023-06-15 DIAGNOSIS — E782 Mixed hyperlipidemia: Secondary | ICD-10-CM | POA: Diagnosis not present

## 2023-06-15 DIAGNOSIS — Z131 Encounter for screening for diabetes mellitus: Secondary | ICD-10-CM | POA: Diagnosis not present

## 2023-06-15 DIAGNOSIS — E039 Hypothyroidism, unspecified: Secondary | ICD-10-CM

## 2023-06-15 DIAGNOSIS — Z1231 Encounter for screening mammogram for malignant neoplasm of breast: Secondary | ICD-10-CM

## 2023-06-15 DIAGNOSIS — Z23 Encounter for immunization: Secondary | ICD-10-CM

## 2023-06-15 DIAGNOSIS — Z0000 Encounter for general adult medical examination without abnormal findings: Secondary | ICD-10-CM | POA: Diagnosis not present

## 2023-06-15 DIAGNOSIS — Z1211 Encounter for screening for malignant neoplasm of colon: Secondary | ICD-10-CM

## 2023-06-15 DIAGNOSIS — I10 Essential (primary) hypertension: Secondary | ICD-10-CM | POA: Diagnosis not present

## 2023-06-15 NOTE — Patient Instructions (Signed)

## 2023-06-15 NOTE — Progress Notes (Signed)
Patient ID: Sarah Suarez, female  DOB: November 06, 1960, 62 y.o.   MRN: [id number] Patient Care Team    Relationship Specialty Notifications Start End  Natalia Leatherwood, DO PCP - General Family Medicine  12/29/17   Ok Edwards, MD (Inactive)  Gynecology  12/29/17   Meryl Dare, MD Consulting Physician Gastroenterology  12/29/17   Jeanann Lewandowsky, MD Consulting Physician Alternative Medicine  12/29/17   Blima Ledger, OD  Optometry  12/29/17     Chief Complaint  Patient presents with   Annual Exam    Pt is not fasting    Subjective:  Sarah Suarez is a 62 y.o.  Female  present for CPE . All past medical history, surgical history, allergies, family history, immunizations, medications and social history were updated in the electronic medical record today. All recent labs, ED visits and hospitalizations within the last year were reviewed.  Had been est with [facility], last seen here 11/2020.  Health maintenance:  Colonoscopy: completed 08/16/2012, by Dr. Russella Dar, referred back today Mammogram: completed:02/17/2022, [facility]> ordered Cervical cancer screening: hysterectomy Immunizations: tdap declined, Influenza declined (encouraged yearly), shingrix  declined Infectious disease screening: HIV and Hep C completed DEXA: ordered today.  Patient has a Dental home. Hospitalizations/ED visits: reviewed      06/15/2023    1:50 PM 06/25/2020    8:11 AM 05/19/2019    1:41 PM 12/29/2017    1:05 PM  Depression screen PHQ 2/9  Decreased Interest 0 0 0 0  Down, Depressed, Hopeless 0 0 0 0  PHQ - 2 Score 0 0 0 0       No data to display          Immunization History  Administered Date(s) Administered   Tdap 06/16/2012     Past Medical History:  Diagnosis Date   Allergy    Chicken pox as a child   Eustachian tube dysfunction, right 12/29/2017   GERD (gastroesophageal reflux disease)    Hematuria    Herpes zoster without complication 10/26/2018   Hyperlipidemia     Hypertension    Kidney stone    Neck problem 06/16/2012   No Known Allergies Past Surgical History:  Procedure Laterality Date   CESAREAN SECTION  1986 and 1987   laparoscopically assisted vaginal hysterectomy  1993   partial- still has ovaries   TUBAL LIGATION     WISDOM TOOTH EXTRACTION  62 yrs old   Family History  Problem Relation Age of Onset   Dementia Father 86   Hyperlipidemia Father    Hypertension Father    Throat cancer Brother 76        smoke- remission?   Alcohol abuse Brother    Dementia Maternal Grandmother    Heart attack Maternal Grandfather    Cancer Paternal Grandfather        unsure of type   Alcohol abuse Brother    Alcohol abuse Brother    Colon cancer Neg Hx    Rectal cancer Neg Hx    Esophageal cancer Neg Hx    Stomach cancer Neg Hx    Social History   Social History Narrative   Married. 2 children.    HS graduate. Admin asst.    Exercise routinely.    Drinks caffeine, takes daily vitamin.    Feels safe in her relationships.     Allergies as of 06/15/2023   No Known Allergies      Medication List  Accurate as of June 15, 2023  2:15 PM. If you have any questions, ask your nurse or doctor.          STOP taking these medications    levothyroxine 50 MCG tablet Commonly known as: SYNTHROID Stopped by: Felix Pacini        All past medical history, surgical history, allergies, family history, immunizations andmedications were updated in the EMR today and reviewed under the history and medication portions of their EMR.     No results found for this or any previous visit (from the past 2160 hour(s)).  MM 3D SCREEN BREAST BILATERAL Result Date: 02/18/2022 IMPRESSION: No mammographic evidence of malignancy. A result letter of this screening mammogram will be mailed directly to the patient. RECOMMENDATION: Screening mammogram in one year. (Code:SM-B-01Y) BI-RADS CATEGORY  1: Negative. Electronically Signed   By: Amie Portland M.D.   On: 02/18/2022 15:48    ROS 14 pt review of systems performed and negative (unless mentioned in an HPI)  Objective: BP 138/88   Pulse 67   Temp 97.7 F (36.5 C)   Ht 5' 4.5" (1.638 m)   Wt 201 lb 12.8 oz (91.5 kg)   SpO2 99%   BMI 34.10 kg/m  Physical Exam Vitals and nursing note reviewed.  Constitutional:      General: She is not in acute distress.    Appearance: Normal appearance. She is not ill-appearing or toxic-appearing.  HENT:     Head: Normocephalic and atraumatic.     Right Ear: Tympanic membrane, ear canal and external ear normal. There is no impacted cerumen.     Left Ear: Tympanic membrane, ear canal and external ear normal. There is no impacted cerumen.     Nose: No congestion or rhinorrhea.     Mouth/Throat:     Mouth: Mucous membranes are moist.     Pharynx: Oropharynx is clear. No oropharyngeal exudate or posterior oropharyngeal erythema.  Eyes:     General: No scleral icterus.       Right eye: No discharge.        Left eye: No discharge.     Extraocular Movements: Extraocular movements intact.     Conjunctiva/sclera: Conjunctivae normal.     Pupils: Pupils are equal, round, and reactive to light.  Cardiovascular:     Rate and Rhythm: Normal rate and regular rhythm.     Pulses: Normal pulses.     Heart sounds: Normal heart sounds. No murmur heard.    No friction rub. No gallop.  Pulmonary:     Effort: Pulmonary effort is normal. No respiratory distress.     Breath sounds: Normal breath sounds. No stridor. No wheezing, rhonchi or rales.  Chest:     Chest wall: No tenderness.  Abdominal:     General: Abdomen is flat. Bowel sounds are normal. There is no distension.     Palpations: Abdomen is soft. There is no mass.     Tenderness: There is no abdominal tenderness. There is no right CVA tenderness, left CVA tenderness, guarding or rebound.     Hernia: No hernia is present.  Musculoskeletal:        General: No swelling, tenderness or  deformity. Normal range of motion.     Cervical back: Normal range of motion and neck supple. No rigidity or tenderness.     Right lower leg: No edema.     Left lower leg: No edema.  Lymphadenopathy:     Cervical: No cervical adenopathy.  Skin:    General: Skin is warm and dry.     Coloration: Skin is not jaundiced or pale.     Findings: No bruising, erythema, lesion or rash.  Neurological:     General: No focal deficit present.     Mental Status: She is alert and oriented to person, place, and time. Mental status is at baseline.     Cranial Nerves: No cranial nerve deficit.     Sensory: No sensory deficit.     Motor: No weakness.     Coordination: Coordination normal.     Gait: Gait normal.     Deep Tendon Reflexes: Reflexes normal.  Psychiatric:        Mood and Affect: Mood normal.        Behavior: Behavior normal.        Thought Content: Thought content normal.        Judgment: Judgment normal.        No results found.  Assessment/plan: Sarah Suarez is a 62 y.o. female present for CPE  Mixed hyperlipidemia - Hemoglobin A1c - Lipid panel hypothyroidism - TSH - Levo was at 50 mcg at one time. Has not taken in a year.  Feels great.  Influenza vaccine needed declined Need for zoster vaccination declined Diabetes mellitus screening - Hemoglobin A1c Breast cancer screening by mammogram - MM 3D SCREENING MAMMOGRAM BILATERAL BREAST; Future Colon cancer screening - Ambulatory referral to Gastroenterology Need for Tdap vaccination declined Routine general medical examination at a health care facility - Comprehensive metabolic panel - CBC Patient was encouraged to exercise greater than 150 minutes a week. Patient was encouraged to choose a diet filled with fresh fruits and vegetables, and lean meats. AVS provided to patient today for education/recommendation on gender specific health and safety maintenance. Colonoscopy: completed 08/16/2012, by Dr. Russella Dar, referred  back today Mammogram: completed:02/17/2022, [facility]> ordered Cervical cancer screening: hysterectomy Immunizations: tdap declined, Influenza declined (encouraged yearly), shingrix  declined Infectious disease screening: HIV and Hep C completed DEXA: ordered today.   Return in about 1 year (around 06/15/2024) for cpe (20 min).  Orders Placed This Encounter  Procedures   MM 3D SCREENING MAMMOGRAM BILATERAL BREAST   Comprehensive metabolic panel   Hemoglobin A1c   TSH   Lipid panel   CBC   Ambulatory referral to Gastroenterology   No orders of the defined types were placed in this encounter.  Referral Orders         Ambulatory referral to Gastroenterology       Electronically signed by: Felix Pacini, DO [facility] [place]

## 2023-06-16 LAB — COMPREHENSIVE METABOLIC PANEL
ALT: 10 U/L (ref 0–35)
AST: 12 U/L (ref 0–37)
Albumin: 4.2 g/dL (ref 3.5–5.2)
Alkaline Phosphatase: 104 U/L (ref 39–117)
BUN: 13 mg/dL (ref 6–23)
CO2: 29 mEq/L (ref 19–32)
Calcium: 9.3 mg/dL (ref 8.4–10.5)
Chloride: 103 mEq/L (ref 96–112)
Creatinine, Ser: 0.8 mg/dL (ref 0.40–1.20)
GFR: 79.27 mL/min (ref >60.00)
Glucose, Bld: 72 mg/dL (ref 70–99)
Potassium: 4.2 mEq/L (ref 3.5–5.1)
Sodium: 140 mEq/L (ref 135–145)
Total Bilirubin: 0.4 mg/dL (ref 0.2–1.2)
Total Protein: 6.5 g/dL (ref 6.0–8.3)

## 2023-06-16 LAB — LIPID PANEL
Cholesterol: 259 mg/dL — ABNORMAL HIGH (ref 0–200)
HDL: 69.1 mg/dL (ref >39.00)
LDL Cholesterol: 163 mg/dL — ABNORMAL HIGH (ref 0–99)
NonHDL: 189.41
Total CHOL/HDL Ratio: 4
Triglycerides: 130 mg/dL (ref 0.0–149.0)
VLDL: 26 mg/dL (ref 0.0–40.0)

## 2023-06-16 LAB — CBC
HCT: 41.2 % (ref 36.0–46.0)
Hemoglobin: 13.5 g/dL (ref 12.0–15.0)
MCHC: 32.9 g/dL (ref 30.0–36.0)
MCV: 92.8 fl (ref 78.0–100.0)
Platelets: 271 10*3/uL (ref 150.0–400.0)
RBC: 4.44 Mil/uL (ref 3.87–5.11)
RDW: 13.2 % (ref 11.5–15.5)
WBC: 8 10*3/uL (ref 4.0–10.5)

## 2023-06-16 LAB — HEMOGLOBIN A1C: Hgb A1c MFr Bld: 5.5 % (ref 4.6–6.5)

## 2023-06-16 LAB — TSH: TSH: 3.04 u[IU]/mL (ref 0.35–5.50)

## 2023-06-22 ENCOUNTER — Encounter: Payer: Self-pay | Admitting: Gastroenterology

## 2023-09-02 ENCOUNTER — Ambulatory Visit: Payer: 59 | Admitting: Gastroenterology

## 2023-12-06 ENCOUNTER — Encounter: Payer: Self-pay | Admitting: Gastroenterology

## 2024-02-23 ENCOUNTER — Ambulatory Visit: Admitting: Gastroenterology

## 2024-04-21 ENCOUNTER — Encounter: Payer: Self-pay | Admitting: Gastroenterology

## 2024-04-21 ENCOUNTER — Ambulatory Visit: Admitting: Gastroenterology

## 2024-04-21 VITALS — BP 130/80 | HR 67 | Ht 64.5 in | Wt 189.0 lb

## 2024-04-21 DIAGNOSIS — Z1211 Encounter for screening for malignant neoplasm of colon: Secondary | ICD-10-CM

## 2024-04-21 DIAGNOSIS — K2100 Gastro-esophageal reflux disease with esophagitis, without bleeding: Secondary | ICD-10-CM | POA: Diagnosis not present

## 2024-04-21 DIAGNOSIS — R1013 Epigastric pain: Secondary | ICD-10-CM | POA: Diagnosis not present

## 2024-04-21 DIAGNOSIS — G8929 Other chronic pain: Secondary | ICD-10-CM

## 2024-04-21 MED ORDER — NA SULFATE-K SULFATE-MG SULF 17.5-3.13-1.6 GM/177ML PO SOLN: 1.0000 | Freq: Once | ORAL | 0 refills | Status: AC

## 2024-04-21 NOTE — Progress Notes (Addendum)
 Chief Complaint:discuss EGD/colon Primary GI Doctor:(previously Dr. Aneita) Dr. San  HPI:  Patient is a  63  year old female patient with past medical history of GERD, hypertension, hyperlipidemia, who presents for follow-up for for a evaluation of endoscopy, colonoscopy .   Patient last seen in GI office in November 2013 by Dr. Aneita.  Interval History    Patient presents for follow-up to discuss endoscopy and colonoscopy. Patient has history of GERD with esophagitis. She reports she has occasional pyrosis, but controls it mostly with her diet. She has had intermittent epigastric pain that can last several days and then resolves on own.  Has been going on for a few months now.  It has not occurred in over 3-4 weeks.  No known triggers. She takes occasional NSAID's. She is currently not taking any antiacids.  Patient denies dysphagia. Patient denies nausea, vomiting, or weight loss.  Patient is due for colon screening colonoscopy. Patient denies altered bowel habits or rectal bleeding  No alcohol use. Nonsmoker.   No blood thinners.   Surgical history: c section x 2, partial hysterectomy.  No significant family history.   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   Wt Readings from Last 3 Encounters:  04/21/24 189 lb (85.7 kg)  06/15/23 201 lb 12.8 oz (91.5 kg)  09/18/21 200 lb (90.7 kg)    Past Medical History:  Diagnosis Date   Allergy    Chicken pox as a child   Eustachian tube dysfunction, right 12/29/2017   GERD (gastroesophageal reflux disease)    Hematuria    Herpes zoster without complication 10/26/2018   Hyperlipidemia    Hypertension    Kidney stone    Neck problem 06/16/2012    Past Surgical History:  Procedure Laterality Date   CESAREAN SECTION  1986 and 1987   laparoscopically assisted vaginal hysterectomy  1993   partial- still has ovaries   TUBAL LIGATION     WISDOM TOOTH EXTRACTION  63 yrs old    No current outpatient  medications on file.   No current facility-administered medications for this visit.    Allergies as of 04/21/2024   (No Known Allergies)    Family History  Problem Relation Age of Onset   Dementia Father 43   Hyperlipidemia Father    Hypertension Father    Throat cancer Brother 25        smoke- remission?   Alcohol abuse Brother    Dementia Maternal Grandmother    Heart attack Maternal Grandfather    Cancer Paternal Grandfather        unsure of type   Alcohol abuse Brother    Alcohol abuse Brother    Colon cancer Neg Hx    Rectal cancer Neg Hx    Esophageal cancer Neg Hx    Stomach cancer Neg Hx     Review of Systems:    Constitutional: No weight loss, fever, chills, weakness or fatigue HEENT: Eyes: No change in vision               Ears, Nose, Throat:  No change in hearing or congestion Skin: No rash or itching Cardiovascular: No chest pain, chest pressure or palpitations   Respiratory: No SOB or cough Gastrointestinal: See HPI and otherwise negative Genitourinary: No dysuria or change in urinary frequency Neurological: No headache, dizziness or syncope Musculoskeletal: No new muscle or joint pain Hematologic: No bleeding or bruising Psychiatric: No history of depression or anxiety  Physical Exam:  Vital signs: BP 130/80   Pulse 67   Ht 5' 4.5 (1.638 m)   Wt 189 lb (85.7 kg)   BMI 31.94 kg/m   Constitutional:   Pleasant  female appears to be in NAD, Well developed, Well nourished, alert and cooperative Throat: Oral cavity and pharynx without inflammation, swelling or lesion.  Respiratory: Respirations even and unlabored. Lungs clear to auscultation bilaterally.   No wheezes, crackles, or rhonchi.  Cardiovascular: Normal S1, S2. Regular rate and rhythm. No peripheral edema, cyanosis or pallor.  Gastrointestinal:  Soft, nondistended, nontender. No rebound or guarding. Normal bowel sounds. No appreciable masses or hepatomegaly. Rectal:  Not performed.  Msk:   Symmetrical without gross deformities. Without edema, no deformity or joint abnormality.  Neurologic:  Alert and  oriented x4;  grossly normal neurologically.  Skin:   Dry and intact without significant lesions or rashes.  RELEVANT LABS AND IMAGING: CBC    Latest Ref Rng & Units 06/15/2023    1:51 PM 11/30/2019    8:31 AM 12/08/2018    8:19 AM  CBC  WBC 4.0 - 10.5 K/uL 8.0  7.2  6.9   Hemoglobin 12.0 - 15.0 g/dL 86.4  86.8  86.3   Hematocrit 36.0 - 46.0 % 41.2  38.7  40.7   Platelets 150.0 - 400.0 K/uL 271.0  241.0  264.0      CMP     Latest Ref Rng & Units 06/15/2023    1:51 PM 11/30/2019    8:31 AM 12/08/2018    8:19 AM  CMP  Glucose 70 - 99 mg/dL 72  92  79   BUN 6 - 23 mg/dL 13  13  12    Creatinine 0.40 - 1.20 mg/dL 9.19  9.26  9.24   Sodium 135 - 145 mEq/L 140  138  137   Potassium 3.5 - 5.1 mEq/L 4.2  4.4  4.0   Chloride 96 - 112 mEq/L 103  102  101   CO2 19 - 32 mEq/L 29  29  29    Calcium  8.4 - 10.5 mg/dL 9.3  9.5  9.4   Total Protein 6.0 - 8.3 g/dL 6.5  6.7  6.7   Total Bilirubin 0.2 - 1.2 mg/dL 0.4  0.6  0.5   Alkaline Phos 39 - 117 U/L 104  102  89   AST 0 - 37 U/L 12  17  13    ALT 0 - 35 U/L 10  18  12       Lab Results  Component Value Date   TSH 3.04 06/15/2023  07/2012 EGD/colon with Dr. Aneita Colonoscopy, recall 10 years Normal colon EGD LA Grade A esophagitis Small HH Path: Diagnosis Surgical [P], distal esophagus - BENIGN SQUAMOUS MUCOSA WITH NO SIGNIFICANT PATHOLOGIC ABNORMALITY. - NO EVIDENCE OF INCREASED INFLAMMATION, INTESTINAL METAPLASIA, DYSPLASIA OR MALIGNANCY IDENTIFIED  Assessment: Encounter Diagnoses  Name Primary?   Special screening for malignant neoplasms, colon Yes   Abdominal pain, chronic, epigastric    Gastroesophageal reflux disease with esophagitis without hemorrhage     63 year old female patient with history of GERD with esophagitis who presents with intermittent episodes of pyrosis and epigastric pain.  Patient is not  currently on any antiacids.  She denies regular NSAID use.  Cannot pinpoint any known triggers.  Will go ahead and order upper GI endoscopy to rule out esophagitis and/or Barrett's.  We discussed starting PPI therapy today however she would like to wait until after procedures.  If negative  exam Deina Lipsey consider imaging. Patient also due for colon screening colonoscopy, currently with no lower GI symptoms.  Will go ahead and schedule EGD and colonoscopy in [facility] with Dr. San.  Plan: - Continue GERD diet, no late meals 3 to 4 hours before bedtime -Schedule for a colonoscopy in [facility] with Dr. San. The risks and benefits of colonoscopy with possible polypectomy / biopsies were discussed and the patient agrees to proceed.  Schedule EGD in [facility] with Dr. San.  The risks and benefits of EGD with possible biopsies and esophageal dilation were discussed with the patient who agrees to proceed.   Thank you for the courtesy of this consult. Please call me with any questions or concerns.   Darly Massi, FNP-C [facility] 04/21/2024, 11:37 AM  Cc: Catherine Fuller A, DO

## 2024-04-21 NOTE — Patient Instructions (Signed)
 We have sent the following medications to your pharmacy for you to pick up at your convenience: SUPREP  You have been scheduled for an endoscopy and colonoscopy. Please follow the written instructions given to you at your visit today.  If you use inhalers (even only as needed), please bring them with you on the day of your procedure.  DO NOT TAKE 7 DAYS PRIOR TO TEST- Trulicity (dulaglutide) Ozempic, Wegovy (semaglutide) Mounjaro (tirzepatide) Bydureon Bcise (exanatide extended release)  DO NOT TAKE 1 DAY PRIOR TO YOUR TEST Rybelsus (semaglutide) Adlyxin (lixisenatide) Victoza (liraglutide) Byetta (exanatide) ___________________________________________________________________________ Due to recent changes in healthcare laws, you may see the results of your imaging and laboratory studies on MyChart before your provider has had a chance to review them.  We understand that in some cases there may be results that are confusing or concerning to you. Not all laboratory results come back in the same time frame and the provider may be waiting for multiple results in order to interpret others.  Please give us  48 hours in order for your provider to thoroughly review all the results before contacting the office for clarification of your results.   _______________________________________________________  If your blood pressure at your visit was 140/90 or greater, please contact your primary care physician to follow up on this.  _______________________________________________________  If you are age 27 or older, your body mass index should be between 23-30. Your Body mass index is 31.94 kg/m. If this is out of the aforementioned range listed, please consider follow up with your Primary Care Provider.  If you are age 68 or younger, your body mass index should be between 19-25. Your Body mass index is 31.94 kg/m. If this is out of the aformentioned range listed, please consider follow up with your  Primary Care Provider.   ________________________________________________________  The [facility] providers would like to encourage you to use MYCHART to communicate with providers for non-urgent requests or questions.  Due to long hold times on the telephone, sending your provider a message by [facility] may be a faster and more efficient way to get a response.  Please allow 48 business hours for a response.  Please remember that this is for non-urgent requests.  _______________________________________________________  [facility] is using a team-based approach to care.  Your team is made up of your doctor and two to three APPS. Our APPS (Nurse Practitioners and Physician Assistants) work with your physician to ensure care continuity for you. They are fully qualified to address your health concerns and develop a treatment plan. They communicate directly with your gastroenterologist to care for you. Seeing the Advanced Practice Practitioners on your physician's team can help you by facilitating care more promptly, often allowing for earlier appointments, access to diagnostic testing, procedures, and other specialty referrals.   Thank you for trusting me with your gastrointestinal care. Deanna May, NP-C

## 2024-04-27 NOTE — Progress Notes (Signed)
 Agree with the assessment and plan as outlined by [facility], FNP-C.  Sarah Bottino, DO, [facility]

## 2024-05-23 ENCOUNTER — Encounter: Payer: Self-pay | Admitting: Gastroenterology

## 2024-05-29 ENCOUNTER — Encounter: Payer: Self-pay | Admitting: Gastroenterology

## 2024-05-29 ENCOUNTER — Ambulatory Visit: Admitting: Gastroenterology

## 2024-05-29 VITALS — BP 125/70 | HR 51 | Temp 97.5°F | Resp 17 | Ht 64.5 in | Wt 189.0 lb

## 2024-05-29 DIAGNOSIS — K2100 Gastro-esophageal reflux disease with esophagitis, without bleeding: Secondary | ICD-10-CM | POA: Diagnosis not present

## 2024-05-29 DIAGNOSIS — D124 Benign neoplasm of descending colon: Secondary | ICD-10-CM

## 2024-05-29 DIAGNOSIS — Z1211 Encounter for screening for malignant neoplasm of colon: Secondary | ICD-10-CM | POA: Diagnosis present

## 2024-05-29 DIAGNOSIS — K2950 Unspecified chronic gastritis without bleeding: Secondary | ICD-10-CM

## 2024-05-29 DIAGNOSIS — K219 Gastro-esophageal reflux disease without esophagitis: Secondary | ICD-10-CM

## 2024-05-29 DIAGNOSIS — K635 Polyp of colon: Secondary | ICD-10-CM | POA: Diagnosis not present

## 2024-05-29 DIAGNOSIS — G8929 Other chronic pain: Secondary | ICD-10-CM

## 2024-05-29 DIAGNOSIS — K5730 Diverticulosis of large intestine without perforation or abscess without bleeding: Secondary | ICD-10-CM

## 2024-05-29 DIAGNOSIS — D125 Benign neoplasm of sigmoid colon: Secondary | ICD-10-CM

## 2024-05-29 MED ORDER — SODIUM CHLORIDE 0.9 % IV SOLN: 500.0000 mL | Freq: Once | INTRAVENOUS | Status: DC

## 2024-05-29 NOTE — Patient Instructions (Addendum)
 Resume previous diet and medications. Awaiting pathology results. Repeat colonoscopy date to be determined based on pathology results. Handout provided on polyps and diverticulosis.  YOU HAD AN ENDOSCOPIC PROCEDURE TODAY AT THE [facility]:   Refer to the procedure report that was given to you for any specific questions about what was found during the examination.  If the procedure report does not answer your questions, please call your gastroenterologist to clarify.  If you requested that your care partner not be given the details of your procedure findings, then the procedure report has been included in a sealed envelope for you to review at your convenience later.  YOU SHOULD EXPECT: Some feelings of bloating in the abdomen. Passage of more gas than usual.  Walking can help get rid of the air that was put into your GI tract during the procedure and reduce the bloating. If you had a lower endoscopy (such as a colonoscopy or flexible sigmoidoscopy) you may notice spotting of blood in your stool or on the toilet paper. If you underwent a bowel prep for your procedure, you may not have a normal bowel movement for a few days.  Please Note:  You might notice some irritation and congestion in your nose or some drainage.  This is from the oxygen used during your procedure.  There is no need for concern and it should clear up in a day or so.  SYMPTOMS TO REPORT IMMEDIATELY:  Following lower endoscopy (colonoscopy or flexible sigmoidoscopy):  Excessive amounts of blood in the stool  Significant tenderness or worsening of abdominal pains  Swelling of the abdomen that is new, acute  Fever of 100F or higher  Following upper endoscopy (EGD)  Vomiting of blood or coffee ground material  New chest pain or pain under the shoulder blades  Painful or persistently difficult swallowing  New shortness of breath  Fever of 100F or higher  Black, tarry-looking stools  For urgent or emergent  issues, a gastroenterologist can be reached at any hour by calling [phone]. Do not use MyChart messaging for urgent concerns.    DIET:  We do recommend a small meal at first, but then you may proceed to your regular diet.  Drink plenty of fluids but you should avoid alcoholic beverages for 24 hours.  ACTIVITY:  You should plan to take it easy for the rest of today and you should NOT DRIVE or use heavy machinery until tomorrow (because of the sedation medicines used during the test).    FOLLOW UP: Our staff will call the number listed on your records the next business day following your procedure.  We will call around 7:15- 8:00 am to check on you and address any questions or concerns that you may have regarding the information given to you following your procedure. If we do not reach you, we will leave a message.     If any biopsies were taken you will be contacted by phone or by letter within the next 1-3 weeks.  Please call us  at [phone] if you have not heard about the biopsies in 3 weeks.    SIGNATURES/CONFIDENTIALITY: You and/or your care partner have signed paperwork which will be entered into your electronic medical record.  These signatures attest to the fact that that the information above on your After Visit Summary has been reviewed and is understood.  Full responsibility of the confidentiality of this discharge information lies with you and/or your care-partner.

## 2024-05-29 NOTE — Progress Notes (Signed)
 GASTROENTEROLOGY PROCEDURE H&P NOTE   Primary Care Physician: Catherine Charlies LABOR, DO    Reason for Procedure:  GERD with esophagitis, heartburn, epigastric pain, colon cancer screening  Plan:    EGD, colonoscopy  Patient is appropriate for endoscopic procedure(s) in the ambulatory ([facility]) setting.  The nature of the procedure, as well as the risks, benefits, and alternatives were carefully and thoroughly reviewed with the patient. Ample time for discussion and questions allowed. The patient understood, was satisfied, and agreed to proceed.     HPI: Sarah Suarez is a 62 y.o. female who presents for EGD for evaluation of reflux.  History of GERD with LA Grade A erosive esophagitis and small hiatal hernia on EGD in 07/2012.  Has intermittent heartburn and epigastric pain.  Not currently on any acid suppression therapy.  Was seen in the GI clinic on 04/21/2024 and scheduled for upper endoscopy.  Additionally, due for repeat colonoscopy for ongoing CRC screening.  Last colonoscopy was 07/2022 and normal.  No lower GI symptoms.  Past Medical History:  Diagnosis Date   Allergy    Chicken pox as a child   Eustachian tube dysfunction, right 12/29/2017   GERD (gastroesophageal reflux disease)    Hematuria    Herpes zoster without complication 10/26/2018   Hyperlipidemia    Hypertension    Kidney stone    Neck problem 06/16/2012    Past Surgical History:  Procedure Laterality Date   CESAREAN SECTION  1986 and 1987   laparoscopically assisted vaginal hysterectomy  1993   partial- still has ovaries   TUBAL LIGATION     WISDOM TOOTH EXTRACTION  63 yrs old    Prior to Admission medications   Not on File    No current outpatient medications on file.   No current facility-administered medications for this visit.    Allergies as of 05/29/2024   (No Known Allergies)    Family History  Problem Relation Age of Onset   Dementia Father 63   Hyperlipidemia Father    Hypertension  Father    Throat cancer Brother 54        smoke- remission?   Alcohol abuse Brother    Dementia Maternal Grandmother    Heart attack Maternal Grandfather    Cancer Paternal Grandfather        unsure of type   Alcohol abuse Brother    Alcohol abuse Brother    Colon cancer Neg Hx    Rectal cancer Neg Hx    Esophageal cancer Neg Hx    Stomach cancer Neg Hx     Social History   Socioeconomic History   Marital status: Married    Spouse name: Not on file   Number of children: 2   Years of education: Not on file   Highest education level: Not on file  Occupational History   Occupation: administration    Employer: [employer]   Occupation: Diplomatic Services operational officer  Tobacco Use   Smoking status: Former    Current packs/day: 0.00    Types: Cigarettes    Quit date: 09/22/1979    Years since quitting: 44.7   Smokeless tobacco: Never   Tobacco comments:    a little in high school  Vaping Use   Vaping status: Never Used  Substance and Sexual Activity   Alcohol use: No    Alcohol/week: 0.0 standard drinks of alcohol   Drug use: No   Sexual activity: Yes    Partners: Male    Birth  control/protection: Surgical    Comment: Hysterectomy 1993   Other Topics Concern   Not on file  Social History Narrative   Married. 2 children.    HS graduate. Admin asst.    Exercise routinely.    Drinks caffeine, takes daily vitamin.    Feels safe in her relationships.    Social Drivers of Corporate investment banker Strain: Not on file  Food Insecurity: Not on file  Transportation Needs: Not on file  Physical Activity: Not on file  Stress: Not on file  Social Connections: Not on file  Intimate Partner Violence: Not on file    Physical Exam: Vital signs in last 24 hours: @There  were no vitals taken for this visit. GEN: NAD EYE: Sclerae anicteric ENT: MMM CV: Non-tachycardic Pulm: CTA b/l GI: Soft, NT/ND NEURO:  Alert & Oriented x 3   Sandor Flatter, DO [facility]   05/29/2024 10:52 AM

## 2024-05-29 NOTE — Op Note (Signed)
 [facility] Patient Name: Sarah Suarez Procedure Date: 05/29/2024 11:20 AM MRN: [id number] Endoscopist: Sandor Flatter , MD, [id number] Age: 63 Referring MD:  Date of Birth: [birth year] Gender: Female Account #: [id number] Procedure:                Upper GI endoscopy Indications:              Epigastric abdominal pain, Heartburn, Esophageal                            reflux, Regurgitation Medicines:                Monitored Anesthesia Care Procedure:                Pre-Anesthesia Assessment:                           - Prior to the procedure, a History and Physical                            was performed, and patient medications and                            allergies were reviewed. The patient's tolerance of                            previous anesthesia was also reviewed. The risks                            and benefits of the procedure and the sedation                            options and risks were discussed with the patient.                            All questions were answered, and informed consent                            was obtained. Prior Anticoagulants: The patient has                            taken no anticoagulant or antiplatelet agents. ASA                            Grade Assessment: II - A patient with mild systemic                            disease. After reviewing the risks and benefits,                            the patient was deemed in satisfactory condition to                            undergo the procedure.  After obtaining informed consent, the endoscope was                            passed under direct vision. Throughout the                            procedure, the patient's blood pressure, pulse, and                            oxygen saturations were monitored continuously. The                            Olympus scope [id number] was introduced through the                            mouth, and advanced to the third  part of duodenum.                            The upper GI endoscopy was accomplished without                            difficulty. The patient tolerated the procedure                            well. Scope In: Scope Out: Findings:                 The examined esophagus was normal.                           The Z-line was regular and was found 36 cm from the                            incisors.                           The gastroesophageal flap valve was visualized                            endoscopically and classified as Hill Grade II                            (fold present, opens with respiration).                           The entire examined stomach was normal. Biopsies                            were taken with a cold forceps for Helicobacter                            pylori testing. Estimated blood loss was minimal.                           The examined duodenum was normal. Complications:  No immediate complications. Estimated Blood Loss:     Estimated blood loss was minimal. Impression:               - Normal esophagus.                           - Z-line regular, 36 cm from the incisors.                           - Gastroesophageal flap valve classified as Hill                            Grade II (fold present, opens with respiration).                           - Normal stomach. Biopsied.                           - Normal examined duodenum. Recommendation:           - Patient has a contact number available for                            emergencies. The signs and symptoms of potential                            delayed complications were discussed with the                            patient. Return to normal activities tomorrow.                            Written discharge instructions were provided to the                            patient.                           - Resume previous diet.                           - Continue present medications.                            - Await pathology results. Sandor Flatter, MD 05/29/2024 12:04:14 PM

## 2024-05-29 NOTE — Progress Notes (Signed)
 Called to room to assist during endoscopic procedure.  Patient ID and intended procedure confirmed with present staff. Received instructions for my participation in the procedure from the performing physician.

## 2024-05-29 NOTE — Progress Notes (Signed)
 Pt's states no medical or surgical changes since previsit or office visit.

## 2024-05-29 NOTE — Op Note (Signed)
 [facility] Patient Name: Sarah Suarez Procedure Date: 05/29/2024 11:12 AM MRN: [id number] Endoscopist: Sandor Flatter , MD, [id number] Age: 63 Referring MD:  Date of Birth: 01-03-1961 Gender: Female Account #: [id number] Procedure:                Colonoscopy Indications:              Screening for colorectal malignant neoplasm (last                            colonoscopy was more than 10 years ago) Medicines:                Monitored Anesthesia Care Procedure:                Pre-Anesthesia Assessment:                           - Prior to the procedure, a History and Physical                            was performed, and patient medications and                            allergies were reviewed. The patient's tolerance of                            previous anesthesia was also reviewed. The risks                            and benefits of the procedure and the sedation                            options and risks were discussed with the patient.                            All questions were answered, and informed consent                            was obtained. Prior Anticoagulants: The patient has                            taken no anticoagulant or antiplatelet agents. ASA                            Grade Assessment: II - A patient with mild systemic                            disease. After reviewing the risks and benefits,                            the patient was deemed in satisfactory condition to                            undergo the procedure.  After obtaining informed consent, the colonoscope                            was passed under direct vision. Throughout the                            procedure, the patient's blood pressure, pulse, and                            oxygen saturations were monitored continuously. The                            Olympus Scope SN: [id number] was introduced through                            the anus and  advanced to the the terminal ileum.                            The colonoscopy was performed without difficulty.                            The patient tolerated the procedure well. The                            quality of the bowel preparation was good. The                            terminal ileum, ileocecal valve, appendiceal                            orifice, and rectum were photographed. Scope In: 11:39:51 AM Scope Out: 11:58:45 AM Scope Withdrawal Time: 0 hours 15 minutes 57 seconds  Total Procedure Duration: 0 hours 18 minutes 54 seconds  Findings:                 The perianal and digital rectal examinations were                            normal.                           Two sessile polyps were found in the descending                            colon. The polyps were 3 to 4 mm in size. These                            polyps were removed with a cold snare. Resection                            and retrieval were complete. Estimated blood loss                            was minimal.  A 5 mm polyp was found in the sigmoid colon. The                            polyp was sessile. The polyp was removed with a                            cold snare. Resection and retrieval were complete.                            Estimated blood loss was minimal.                           Multiple medium-mouthed and small-mouthed                            diverticula were found in the sigmoid colon.                           The retroflexed view of the distal rectum and anal                            verge was normal and showed no anal or rectal                            abnormalities.                           The terminal ileum appeared normal. Complications:            No immediate complications. Estimated Blood Loss:     Estimated blood loss was minimal. Impression:               - Two 3 to 4 mm polyps in the descending colon,                            removed with a  cold snare. Resected and retrieved.                           - One 5 mm polyp in the sigmoid colon, removed with                            a cold snare. Resected and retrieved.                           - Diverticulosis in the sigmoid colon.                           - The distal rectum and anal verge are normal on                            retroflexion view.                           - The examined portion of the ileum was normal. Recommendation:           -  Patient has a contact number available for                            emergencies. The signs and symptoms of potential                            delayed complications were discussed with the                            patient. Return to normal activities tomorrow.                            Written discharge instructions were provided to the                            patient.                           - Resume previous diet.                           - Continue present medications.                           - Await pathology results.                           - Repeat colonoscopy for surveillance based on                            pathology results.                           - Return to GI clinic PRN. Sandor Flatter, MD 05/29/2024 12:09:23 PM

## 2024-05-29 NOTE — Progress Notes (Signed)
 Sedate, gd SR, tolerated procedure well, VSS, report to RN

## 2024-05-30 ENCOUNTER — Telehealth: Payer: Self-pay

## 2024-05-30 NOTE — Telephone Encounter (Signed)
  Follow up Call-     05/29/2024   11:10 AM  Call back number  Post procedure Call Back phone  # [phone]  Permission to leave phone message Yes     Patient questions:  Do you have a fever, pain , or abdominal swelling? No. Pain Score  0 *  Have you tolerated food without any problems? Yes.    Have you been able to return to your normal activities? Yes.    Do you have any questions about your discharge instructions: Diet   No. Medications  No. Follow up visit  No.  Do you have questions or concerns about your Care? No.  Actions: * If pain score is 4 or above: No action needed, pain <4.

## 2024-06-01 ENCOUNTER — Ambulatory Visit: Payer: Self-pay | Admitting: Gastroenterology

## 2024-06-01 LAB — SURGICAL PATHOLOGY
